# Patient Record
Sex: Male | Born: 1975 | Race: White | Hispanic: No | Marital: Married | Smoking: Current some day smoker
Health system: Southern US, Community
[De-identification: ages and names within clinical notes are randomized; demographics above are authoritative.]

## PROBLEM LIST (undated history)

## (undated) DIAGNOSIS — I10 Essential (primary) hypertension: Secondary | ICD-10-CM

## (undated) DIAGNOSIS — Z9989 Dependence on other enabling machines and devices: Secondary | ICD-10-CM

## (undated) DIAGNOSIS — H9190 Unspecified hearing loss, unspecified ear: Secondary | ICD-10-CM

## (undated) DIAGNOSIS — E039 Hypothyroidism, unspecified: Secondary | ICD-10-CM

## (undated) DIAGNOSIS — E079 Disorder of thyroid, unspecified: Secondary | ICD-10-CM

## (undated) DIAGNOSIS — K219 Gastro-esophageal reflux disease without esophagitis: Secondary | ICD-10-CM

## (undated) DIAGNOSIS — M51369 Other intervertebral disc degeneration, lumbar region without mention of lumbar back pain or lower extremity pain: Secondary | ICD-10-CM

## (undated) DIAGNOSIS — M1990 Unspecified osteoarthritis, unspecified site: Secondary | ICD-10-CM

## (undated) DIAGNOSIS — I2510 Atherosclerotic heart disease of native coronary artery without angina pectoris: Secondary | ICD-10-CM

## (undated) DIAGNOSIS — K760 Fatty (change of) liver, not elsewhere classified: Secondary | ICD-10-CM

## (undated) DIAGNOSIS — G4730 Sleep apnea, unspecified: Secondary | ICD-10-CM

## (undated) DIAGNOSIS — E785 Hyperlipidemia, unspecified: Secondary | ICD-10-CM

## (undated) DIAGNOSIS — I5189 Other ill-defined heart diseases: Secondary | ICD-10-CM

## (undated) DIAGNOSIS — Q892 Congenital malformations of other endocrine glands: Secondary | ICD-10-CM

## (undated) DIAGNOSIS — G4733 Obstructive sleep apnea (adult) (pediatric): Secondary | ICD-10-CM

## (undated) HISTORY — DX: Unspecified osteoarthritis, unspecified site: M19.90

## (undated) HISTORY — DX: Obstructive sleep apnea (adult) (pediatric): G47.33

## (undated) HISTORY — DX: Other ill-defined heart diseases: I51.89

## (undated) HISTORY — DX: Hyperlipidemia, unspecified: E78.5

## (undated) HISTORY — DX: Dependence on other enabling machines and devices: Z99.89

## (undated) HISTORY — DX: Fatty (change of) liver, not elsewhere classified: K76.0

## (undated) HISTORY — DX: Unspecified hearing loss, unspecified ear: H91.90

## (undated) HISTORY — PX: WISDOM TOOTH EXTRACTION: SHX21

## (undated) HISTORY — DX: Atherosclerotic heart disease of native coronary artery without angina pectoris: I25.10

## (undated) HISTORY — DX: Gastro-esophageal reflux disease without esophagitis: K21.9

## (undated) HISTORY — PX: FINGER SURGERY: SHX640

## (undated) HISTORY — DX: Other intervertebral disc degeneration, lumbar region without mention of lumbar back pain or lower extremity pain: M51.369

## (undated) HISTORY — DX: Sleep apnea, unspecified: G47.30

---

## 1999-01-18 ENCOUNTER — Emergency Department (HOSPITAL_COMMUNITY): Admission: EM | Admit: 1999-01-18 | Discharge: 1999-01-18 | Payer: Self-pay | Admitting: Emergency Medicine

## 2001-05-13 DIAGNOSIS — K760 Fatty (change of) liver, not elsewhere classified: Secondary | ICD-10-CM

## 2001-05-13 HISTORY — DX: Fatty (change of) liver, not elsewhere classified: K76.0

## 2001-05-13 HISTORY — PX: LIVER BIOPSY: SHX301

## 2001-05-13 HISTORY — PX: COLONOSCOPY: SHX174

## 2007-05-14 HISTORY — PX: VASECTOMY: SHX75

## 2010-06-02 ENCOUNTER — Encounter: Payer: Self-pay | Admitting: Internal Medicine

## 2013-11-25 ENCOUNTER — Other Ambulatory Visit: Payer: Self-pay | Admitting: Otolaryngology

## 2013-11-25 DIAGNOSIS — E041 Nontoxic single thyroid nodule: Secondary | ICD-10-CM

## 2013-11-26 ENCOUNTER — Ambulatory Visit
Admission: RE | Admit: 2013-11-26 | Discharge: 2013-11-26 | Disposition: A | Discharge status: Home / Self Care | Payer: Managed Care, Other (non HMO) | Source: Ambulatory Visit | Attending: Otolaryngology | Admitting: Otolaryngology

## 2013-11-26 DIAGNOSIS — E041 Nontoxic single thyroid nodule: Secondary | ICD-10-CM

## 2013-12-11 DIAGNOSIS — Q892 Congenital malformations of other endocrine glands: Secondary | ICD-10-CM

## 2013-12-11 HISTORY — DX: Congenital malformations of other endocrine glands: Q89.2

## 2013-12-13 ENCOUNTER — Encounter (HOSPITAL_BASED_OUTPATIENT_CLINIC_OR_DEPARTMENT_OTHER): Payer: Self-pay | Admitting: *Deleted

## 2013-12-13 NOTE — H&P (Signed)
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 Orders  US Soft Tissue Neck; Requested for: 23 Nov 2013. Discussed  Probable thyroglossal duct cyst. Recommend ultrasound evaluation and then we will discuss intervention following that. Reason For Visit  Antonio Ferguson is here today at the kind request of Candi LeashGoines, Valarie for consultation and opinion for knot under chin. HPI  5 or 6 week history of a slightly tender mass in the anterior neck slightly to the right of midline. No other symptoms related to it. He suffered a tick bite a week before. Allergies  Augmentin TABS; Diarrhea Percocet TABS; Nausea and Vomiting. Current Meds  Takes No Medications on a Regular Basis;; RPT. Active Problems  Acute sinusitis   (461.9) (J01.90) Asthma   (493.90) (J45.909) Cigar smoker   (305.1) (Z72.0) Cough   (786.2) (R05) Fatty liver   (571.8) (K76.0) Flu-like symptoms   (780.99) (R68.89) Hyperlipemia   (272.4) (E78.5) Nonalcoholic fatty liver disease   (571.8) (K76.0) Tick bite   (919.4,E906.4) (T14.8,W57.Lorne SkeensXXXA). PMH  Abnormal findings on diagnostic imaging of musculoskeletal system (793.7) (R93.7); Resolved: 17Dec2013; disc space narrowing L5-S1 History of hyperlipidemia (V12.29) (Z86.39) Other specified abnormal findings of blood chemistry (790.6) (R79.89); TSH. PSH  Liver Biopsy; '03 Liver Biopsy Oral Surgery Tooth Extraction Surgery Vas Deferens Vasectomy 22 Apr 2008; PUA GPE 04/22/2008-07/21/2008 DIAGNOSIS: ELECTIVE STERILIZATION. Family Hx  Benign Essential Hypertension; Brother Cardiac Defibrillator; maternal uncles Family history of diabetes mellitus (V18.0) (Z83.3); Paternal grandfather Family history of diabetes mellitus (V18.0) (Z83.3); paternal grandfather Family history of hearing loss: Mother (V19.2) (Z82.2) Family history of hypothyroidism: Brother (V18.19) (Z83.49) Family history of migraine headaches: Mother (V17.2) (Z82.0) Hyperlipidemia; brothers Hyperlipidemia;  Father Hypertension (V17.49); brothers Large Intestine Neoplasm; Paternal grandmother Denied Malignant Neoplasm Of Testis Peripheral Vascular Disease; father Premature Cardiovascular Disease; Maternal and paternal uncle with CABG's in their mid 2350s Sudden/Instantaneous Cardiac Death (V17.41); cousins, maternal uncles Denied Ulcerative Colitis. Personal Hx  Denied Alcohol Use (History) Caffeine Use; Three per day Cigar smoker (305.1) (Z72.0) Denied Current Smoker Denied Drug Use Exercising Regularly; Cardiovascular and weight lifting 1-2 hours at a time three to four days a week Marital History - Currently Married Never a smoker Occupation:; Emergency planning/management officerpolice officer Secondhand smoke exposure (V15.89) (Z77.22). ROS  12 system ROS was obtained and reviewed on the Health Maintenance form dated today.  Positive responses are shown above.  If the symptom is not checked, the patient has denied it. Vital Signs   Recorded by Skolimowski,Sharon on 23 Nov 2013 01:50 PM BP:120/80,  Height: 6 ft 1.5 in, Weight: 265 lb , BMI: 34.5 kg/m2,  BMI Calculated: 34.49 ,  BSA Calculated: 2.44. Physical Exam  APPEARANCE: Well developed, well nourished, in no acute distress.  Normal affect, in a pleasant mood.  Oriented to time, place and person. COMMUNICATION: Normal voice   HEAD & FACE:  No scars, lesions or masses of head and face.  Sinuses nontender to palpation.  Salivary glands without mass or tenderness.  Facial strength symmetric.  No facial lesion, scars, or mass. EYES: EOMI with normal primary gaze alignment. Visual acuity grossly intact.  PERRLA EXTERNAL EAR & NOSE: No scars, lesions or masses  EAC & TYMPANIC MEMBRANE:  EAC shows no obstructing lesions or debris and tympanic membranes are normal bilaterally with good movement to insufflation. GROSS HEARING: Normal  TMJ:  Nontender  INTRANASAL EXAM: No polyps or purulence.  NASOPHARYNX: Normal, without lesions. LIPS, TEETH & GUMS: No lip lesions, normal  dentition and normal gums. ORAL CAVITY/OROPHARYNX:  Oral  mucosa moist without lesion or asymmetry of the palate, tongue, tonsil or posterior pharynx. LARYNX (mirror exam):  No lesions of the epiglottis, false cord or TVC's and cords move well to phonation. HYPOPHARYNX (mirror exam): No lesions, asymmetry or pooling of secretions. NECK:  Supple without adenopathy or mass, except for a one and a half to 2 cm soft cystic mass at the level of the thyrohyoid membrane slightly to the right of midline. THYROID:  Normal with no masses palpable.  NEUROLOGIC:  No gross CN deficits. No nystagmus noted.   LYMPHATIC:  No enlarged nodes palpable. Signature  Electronically signed by : Serena Colonel  M.D.; 11/23/2013 2:12 PM EST. Reviewed by : Emilio Aspen  M.D.; 11/23/2013 3:46 PM EST. Reviewed by : Candi Leash  FNP-BC; 11/23/2013 4:24 PM EST.

## 2013-12-20 ENCOUNTER — Ambulatory Visit (HOSPITAL_BASED_OUTPATIENT_CLINIC_OR_DEPARTMENT_OTHER)
Admission: RE | Admit: 2013-12-20 | Discharge: 2013-12-21 | Disposition: A | Discharge status: Home / Self Care | Payer: Managed Care, Other (non HMO) | Source: Ambulatory Visit | Attending: Otolaryngology | Admitting: Otolaryngology

## 2013-12-20 ENCOUNTER — Encounter (HOSPITAL_BASED_OUTPATIENT_CLINIC_OR_DEPARTMENT_OTHER)
Admission: RE | Disposition: A | Discharge status: Home / Self Care | Payer: Self-pay | Source: Ambulatory Visit | Attending: Otolaryngology

## 2013-12-20 ENCOUNTER — Encounter (HOSPITAL_BASED_OUTPATIENT_CLINIC_OR_DEPARTMENT_OTHER): Payer: Managed Care, Other (non HMO) | Admitting: Anesthesiology

## 2013-12-20 ENCOUNTER — Ambulatory Visit (HOSPITAL_BASED_OUTPATIENT_CLINIC_OR_DEPARTMENT_OTHER): Payer: Managed Care, Other (non HMO) | Admitting: Anesthesiology

## 2013-12-20 ENCOUNTER — Encounter (HOSPITAL_BASED_OUTPATIENT_CLINIC_OR_DEPARTMENT_OTHER): Payer: Self-pay | Admitting: Anesthesiology

## 2013-12-20 DIAGNOSIS — E041 Nontoxic single thyroid nodule: Secondary | ICD-10-CM | POA: Insufficient documentation

## 2013-12-20 DIAGNOSIS — Z880 Allergy status to penicillin: Secondary | ICD-10-CM | POA: Diagnosis not present

## 2013-12-20 DIAGNOSIS — Q892 Congenital malformations of other endocrine glands: Secondary | ICD-10-CM

## 2013-12-20 HISTORY — PX: THYROGLOSSAL DUCT CYST: SHX297

## 2013-12-20 HISTORY — DX: Disorder of thyroid, unspecified: E07.9

## 2013-12-20 HISTORY — DX: Congenital malformations of other endocrine glands: Q89.2

## 2013-12-20 LAB — POCT HEMOGLOBIN-HEMACUE: HEMOGLOBIN: 15.1 g/dL (ref 13.0–17.0)

## 2013-12-20 SURGERY — EXCISION, THYROGLOSSAL DUCT CYST
Anesthesia: General | Site: Neck

## 2013-12-20 MED ORDER — CLINDAMYCIN HCL 300 MG PO CAPS: 300.0000 mg | ORAL_CAPSULE | Freq: Three times a day (TID) | ORAL | Status: DC

## 2013-12-20 MED ORDER — HYDROMORPHONE HCL PF 1 MG/ML IJ SOLN
INTRAMUSCULAR | Status: AC
  Filled 2013-12-20: qty 1

## 2013-12-20 MED ORDER — DEXTROSE-NACL 5-0.9 % IV SOLN
INTRAVENOUS | Status: DC
  Administered 2013-12-20: 11:00:00 via INTRAVENOUS

## 2013-12-20 MED ORDER — ONDANSETRON HCL 4 MG/2ML IJ SOLN: 4.0000 mg | Freq: Four times a day (QID) | INTRAMUSCULAR | Status: DC | PRN

## 2013-12-20 MED ORDER — HYDROCODONE-ACETAMINOPHEN 5-325 MG PO TABS
1.0000 | ORAL_TABLET | ORAL | Status: DC | PRN
  Administered 2013-12-20 – 2013-12-21 (×6): 2 via ORAL
  Filled 2013-12-20 (×6): qty 2

## 2013-12-20 MED ORDER — OXYMETAZOLINE HCL 0.05 % NA SOLN
NASAL | Status: AC
  Filled 2013-12-20: qty 15

## 2013-12-20 MED ORDER — LIDOCAINE-EPINEPHRINE 1 %-1:100000 IJ SOLN
INTRAMUSCULAR | Status: AC
  Filled 2013-12-20: qty 1

## 2013-12-20 MED ORDER — OXYCODONE HCL 5 MG/5ML PO SOLN: 5.0000 mg | Freq: Once | ORAL | Status: AC | PRN

## 2013-12-20 MED ORDER — IBUPROFEN 100 MG/5ML PO SUSP
ORAL | Status: AC
  Filled 2013-12-20: qty 20

## 2013-12-20 MED ORDER — CEFAZOLIN SODIUM-DEXTROSE 2-3 GM-% IV SOLR
INTRAVENOUS | Status: DC | PRN
  Administered 2013-12-20: 2 g via INTRAVENOUS

## 2013-12-20 MED ORDER — PROMETHAZINE HCL 25 MG PO TABS: 25.0000 mg | ORAL_TABLET | Freq: Four times a day (QID) | ORAL | Status: DC | PRN

## 2013-12-20 MED ORDER — PROPOFOL 10 MG/ML IV BOLUS
INTRAVENOUS | Status: DC | PRN
  Administered 2013-12-20: 100 mg via INTRAVENOUS
  Administered 2013-12-20: 200 mg via INTRAVENOUS

## 2013-12-20 MED ORDER — IBUPROFEN 100 MG/5ML PO SUSP
400.0000 mg | Freq: Four times a day (QID) | ORAL | Status: DC | PRN
  Administered 2013-12-20: 400 mg via ORAL

## 2013-12-20 MED ORDER — HYDROCODONE-ACETAMINOPHEN 7.5-325 MG PO TABS: 1.0000 | ORAL_TABLET | Freq: Four times a day (QID) | ORAL | Status: DC | PRN

## 2013-12-20 MED ORDER — LACTATED RINGERS IV SOLN
INTRAVENOUS | Status: DC
  Administered 2013-12-20 (×2): via INTRAVENOUS

## 2013-12-20 MED ORDER — BACITRACIN ZINC 500 UNIT/GM EX OINT
TOPICAL_OINTMENT | CUTANEOUS | Status: AC
  Filled 2013-12-20: qty 0.9

## 2013-12-20 MED ORDER — CEFAZOLIN SODIUM-DEXTROSE 2-3 GM-% IV SOLR
2.0000 g | INTRAVENOUS | Status: AC
  Administered 2013-12-20: 2 g via INTRAVENOUS

## 2013-12-20 MED ORDER — MIDAZOLAM HCL 2 MG/2ML IJ SOLN
INTRAMUSCULAR | Status: AC
  Filled 2013-12-20: qty 2

## 2013-12-20 MED ORDER — OXYCODONE HCL 5 MG PO TABS: 5.0000 mg | ORAL_TABLET | Freq: Once | ORAL | Status: AC | PRN

## 2013-12-20 MED ORDER — ONDANSETRON HCL 4 MG/2ML IJ SOLN
INTRAMUSCULAR | Status: DC | PRN
  Administered 2013-12-20: 4 mg via INTRAVENOUS

## 2013-12-20 MED ORDER — HYDROMORPHONE HCL PF 1 MG/ML IJ SOLN
0.2500 mg | INTRAMUSCULAR | Status: DC | PRN
  Administered 2013-12-20 (×4): 0.5 mg via INTRAVENOUS

## 2013-12-20 MED ORDER — MIDAZOLAM HCL 5 MG/5ML IJ SOLN
INTRAMUSCULAR | Status: DC | PRN
  Administered 2013-12-20: 2 mg via INTRAVENOUS

## 2013-12-20 MED ORDER — MIDAZOLAM HCL 2 MG/ML PO SYRP: 12.0000 mg | ORAL_SOLUTION | Freq: Once | ORAL | Status: DC | PRN

## 2013-12-20 MED ORDER — PROMETHAZINE HCL 25 MG RE SUPP: 25.0000 mg | Freq: Four times a day (QID) | RECTAL | Status: DC | PRN

## 2013-12-20 MED ORDER — CLINDAMYCIN HCL 300 MG PO CAPS
300.0000 mg | ORAL_CAPSULE | Freq: Three times a day (TID) | ORAL | Status: DC
  Administered 2013-12-20 – 2013-12-21 (×3): 300 mg via ORAL
  Filled 2013-12-20 (×3): qty 1

## 2013-12-20 MED ORDER — LIDOCAINE HCL (CARDIAC) 20 MG/ML IV SOLN
INTRAVENOUS | Status: DC | PRN
Start: 2013-12-20 — End: 2013-12-20
  Administered 2013-12-20: 50 mg via INTRAVENOUS

## 2013-12-20 MED ORDER — FENTANYL CITRATE 0.05 MG/ML IJ SOLN
INTRAMUSCULAR | Status: AC
  Filled 2013-12-20: qty 6

## 2013-12-20 MED ORDER — MIDAZOLAM HCL 2 MG/2ML IJ SOLN: 1.0000 mg | INTRAMUSCULAR | Status: DC | PRN

## 2013-12-20 MED ORDER — CEFAZOLIN SODIUM-DEXTROSE 2-3 GM-% IV SOLR
INTRAVENOUS | Status: AC
  Filled 2013-12-20: qty 50

## 2013-12-20 MED ORDER — FENTANYL CITRATE 0.05 MG/ML IJ SOLN
INTRAMUSCULAR | Status: DC | PRN
  Administered 2013-12-20 (×2): 50 ug via INTRAVENOUS

## 2013-12-20 MED ORDER — SUCCINYLCHOLINE CHLORIDE 20 MG/ML IJ SOLN
INTRAMUSCULAR | Status: DC | PRN
  Administered 2013-12-20: 50 mg via INTRAVENOUS

## 2013-12-20 MED ORDER — FENTANYL CITRATE 0.05 MG/ML IJ SOLN: 50.0000 ug | INTRAMUSCULAR | Status: DC | PRN

## 2013-12-20 MED ORDER — 0.9 % SODIUM CHLORIDE (POUR BTL) OPTIME
TOPICAL | Status: DC | PRN
  Administered 2013-12-20: 300 mL

## 2013-12-20 MED ORDER — DEXAMETHASONE SODIUM PHOSPHATE 4 MG/ML IJ SOLN
INTRAMUSCULAR | Status: DC | PRN
  Administered 2013-12-20: 10 mg via INTRAVENOUS

## 2013-12-20 SURGICAL SUPPLY — 61 items
ADH SKN CLS APL DERMABOND .7 (GAUZE/BANDAGES/DRESSINGS) ×1
APL SKNCLS STERI-STRIP NONHPOA (GAUZE/BANDAGES/DRESSINGS)
ATTRACTOMAT 16X20 MAGNETIC DRP (DRAPES) IMPLANT
BENZOIN TINCTURE PRP APPL 2/3 (GAUZE/BANDAGES/DRESSINGS) IMPLANT
BLADE SURG 15 STRL LF DISP TIS (BLADE) ×1 IMPLANT
BLADE SURG 15 STRL SS (BLADE) ×3
CANISTER SUCT 1200ML W/VALVE (MISCELLANEOUS) ×3 IMPLANT
CLEANER CAUTERY TIP 5X5 PAD (MISCELLANEOUS) IMPLANT
CLOSURE WOUND 1/2 X4 (GAUZE/BANDAGES/DRESSINGS)
CLOSURE WOUND 1/4X4 (GAUZE/BANDAGES/DRESSINGS)
CORDS BIPOLAR (ELECTRODE) IMPLANT
COVER MAYO STAND STRL (DRAPES) ×3 IMPLANT
COVER TABLE BACK 60X90 (DRAPES) ×3 IMPLANT
DECANTER SPIKE VIAL GLASS SM (MISCELLANEOUS) IMPLANT
DERMABOND ADVANCED (GAUZE/BANDAGES/DRESSINGS) ×2
DERMABOND ADVANCED .7 DNX12 (GAUZE/BANDAGES/DRESSINGS) IMPLANT
DRAIN PENROSE 1/4X12 LTX STRL (WOUND CARE) IMPLANT
DRAPE U-SHAPE 76X120 STRL (DRAPES) ×3 IMPLANT
DRSG EMULSION OIL 3X3 NADH (GAUZE/BANDAGES/DRESSINGS) IMPLANT
ELECT COATED BLADE 2.86 ST (ELECTRODE) ×3 IMPLANT
ELECT REM PT RETURN 9FT ADLT (ELECTROSURGICAL) ×3
ELECTRODE REM PT RTRN 9FT ADLT (ELECTROSURGICAL) ×1 IMPLANT
GAUZE SPONGE 4X4 16PLY XRAY LF (GAUZE/BANDAGES/DRESSINGS) IMPLANT
GLOVE ECLIPSE 6.5 STRL STRAW (GLOVE) IMPLANT
GLOVE ECLIPSE 7.5 STRL STRAW (GLOVE) ×1 IMPLANT
GLOVE EXAM NITRILE PF MED BLUE (GLOVE) ×2 IMPLANT
GLOVE SURG SS PI 6.5 STRL IVOR (GLOVE) ×2 IMPLANT
GLOVE SURG SS PI 7.5 STRL IVOR (GLOVE) ×2 IMPLANT
GOWN STRL REUS W/ TWL LRG LVL3 (GOWN DISPOSABLE) ×1 IMPLANT
GOWN STRL REUS W/TWL LRG LVL3 (GOWN DISPOSABLE) ×3
NDL HYPO 25X1 1.5 SAFETY (NEEDLE) ×1 IMPLANT
NEEDLE HYPO 25X1 1.5 SAFETY (NEEDLE) ×3 IMPLANT
NS IRRIG 1000ML POUR BTL (IV SOLUTION) ×3 IMPLANT
PACK BASIN DAY SURGERY FS (CUSTOM PROCEDURE TRAY) ×3 IMPLANT
PAD CLEANER CAUTERY TIP 5X5 (MISCELLANEOUS) ×2
PENCIL FOOT CONTROL (ELECTRODE) ×3 IMPLANT
RUBBERBAND STERILE (MISCELLANEOUS) ×2 IMPLANT
SHEET MEDIUM DRAPE 40X70 STRL (DRAPES) IMPLANT
SPONGE GAUZE 4X4 12PLY STER LF (GAUZE/BANDAGES/DRESSINGS) ×2 IMPLANT
SPONGE INTESTINAL PEANUT (DISPOSABLE) IMPLANT
STAPLER VISISTAT 35W (STAPLE) IMPLANT
STRIP CLOSURE SKIN 1/2X4 (GAUZE/BANDAGES/DRESSINGS) IMPLANT
STRIP CLOSURE SKIN 1/4X4 (GAUZE/BANDAGES/DRESSINGS) IMPLANT
SUCTION FRAZIER TIP 10 FR DISP (SUCTIONS) IMPLANT
SUT CHROMIC 3 0 PS 2 (SUTURE) ×2 IMPLANT
SUT CHROMIC 4 0 P 3 18 (SUTURE) IMPLANT
SUT ETHILON 5 0 P 3 18 (SUTURE)
SUT NYLON ETHILON 5-0 P-3 1X18 (SUTURE) IMPLANT
SUT SILK 3 0 TIES 17X18 (SUTURE)
SUT SILK 3-0 18XBRD TIE BLK (SUTURE) IMPLANT
SUT SILK 4 0 TIES 17X18 (SUTURE) IMPLANT
SUT SURG 6 0 PRE1/P 10 (SUTURE) IMPLANT
SWAB COLLECTION DEVICE MRSA (MISCELLANEOUS) IMPLANT
SYR BULB 3OZ (MISCELLANEOUS) ×3 IMPLANT
SYR CONTROL 10ML LL (SYRINGE) ×3 IMPLANT
TAPE PAPER 3X10 WHT MICROPORE (GAUZE/BANDAGES/DRESSINGS) ×3 IMPLANT
TOWEL OR 17X24 6PK STRL BLUE (TOWEL DISPOSABLE) ×3 IMPLANT
TRAY DSU PREP LF (CUSTOM PROCEDURE TRAY) ×3 IMPLANT
TUBE ANAEROBIC SPECIMEN COL (MISCELLANEOUS) IMPLANT
TUBE CONNECTING 20'X1/4 (TUBING) ×1
TUBE CONNECTING 20X1/4 (TUBING) ×2 IMPLANT

## 2013-12-20 NOTE — Discharge Instructions (Signed)
You may shower on Wednesday. It is okay to use soap and water but do not use any creams, oils or ointment.

## 2013-12-20 NOTE — Anesthesia Postprocedure Evaluation (Signed)
Anesthesia Post Note  Patient: Antonio RankinsRandall D Ferguson  Procedure(s) Performed: Procedure(s) (LRB): EXCISION THYROGLOSSAL DUCT CYST (N/A)  Anesthesia type: General  Patient location: PACU  Post pain: Pain level controlled and Adequate analgesia  Post assessment: Post-op Vital signs reviewed, Patient's Cardiovascular Status Stable, Respiratory Function Stable, Patent Airway and Pain level controlled  Last Vitals:  Filed Vitals:   12/20/13 1045  BP: 141/84  Pulse: 79  Temp:   Resp: 15    Post vital signs: Reviewed and stable  Level of consciousness: awake, alert  and oriented  Complications: No apparent anesthesia complications

## 2013-12-20 NOTE — Progress Notes (Signed)
Day of Surgery  Subjective: Feeling great. No problems voice nl  Objective: Vital signs in last 24 hours: Temp:  [96.9 F (36.1 C)-98.2 F (36.8 C)] 96.9 F (36.1 C) (08/10 1900) Pulse Rate:  [68-96] 81 (08/10 1900) Resp:  [14-23] 16 (08/10 1900) BP: (127-141)/(74-88) 132/74 mmHg (08/10 1900) SpO2:  [93 %-100 %] 95 % (08/10 1900) Weight:  [122.641 kg (270 lb 6 oz)] 122.641 kg (270 lb 6 oz) (08/10 0756)    Intake/Output from previous day:   Intake/Output this shift:    wound excellent.  Lab Results:   Recent Labs  12/20/13 0813  HGB 15.1   BMET No results found for this basename: NA, K, CL, CO2, GLUCOSE, BUN, CREATININE, CALCIUM,  in the last 72 hours PT/INR No results found for this basename: LABPROT, INR,  in the last 72 hours ABG No results found for this basename: PHART, PCO2, PO2, HCO3,  in the last 72 hours  Studies/Results: No results found.  Anti-infectives: Anti-infectives   Start     Dose/Rate Route Frequency Ordered Stop   12/20/13 1400  clindamycin (CLEOCIN) capsule 300 mg     300 mg Oral 3 times per day 12/20/13 1134     12/20/13 0753  ceFAZolin (ANCEF) IVPB 2 g/50 mL premix     2 g 100 mL/hr over 30 Minutes Intravenous On call to O.R. 12/20/13 0753 12/20/13 0857   12/20/13 0000  clindamycin (CLEOCIN) 300 MG capsule     300 mg Oral 3 times daily 12/20/13 1134        Assessment/Plan: s/p Procedure(s): EXCISION THYROGLOSSAL DUCT CYST (N/A) continue care drain out in am  LOS: 0 days    Suzanna ObeyBYERS, Arslan Kier 12/20/2013

## 2013-12-20 NOTE — Anesthesia Preprocedure Evaluation (Signed)
Anesthesia Evaluation  Patient identified by MRN, date of birth, ID band Patient awake    Reviewed: Allergy & Precautions, H&P , NPO status , Patient's Chart, lab work & pertinent test results  Airway Mallampati: II  Neck ROM: full    Dental   Pulmonary neg pulmonary ROS,          Cardiovascular negative cardio ROS      Neuro/Psych    GI/Hepatic   Endo/Other  obese  Renal/GU      Musculoskeletal   Abdominal   Peds  Hematology   Anesthesia Other Findings   Reproductive/Obstetrics                           Anesthesia Physical Anesthesia Plan  ASA: I  Anesthesia Plan: General   Post-op Pain Management:    Induction: Intravenous  Airway Management Planned: Oral ETT  Additional Equipment:   Intra-op Plan:   Post-operative Plan: Extubation in OR  Informed Consent: I have reviewed the patients History and Physical, chart, labs and discussed the procedure including the risks, benefits and alternatives for the proposed anesthesia with the patient or authorized representative who has indicated his/her understanding and acceptance.     Plan Discussed with: CRNA, Anesthesiologist and Surgeon  Anesthesia Plan Comments:         Anesthesia Quick Evaluation

## 2013-12-20 NOTE — Op Note (Signed)
OPERATIVE REPORT  DATE OF SURGERY: 12/20/2013  PATIENT:  Antonio Ferguson,  38 y.o. male  PRE-OPERATIVE DIAGNOSIS:  THYROGLOSSAL DUCT CYST  POST-OPERATIVE DIAGNOSIS:  THYROGLOSSAL DUCT CYST  PROCEDURE:  Procedure(s): EXCISION THYROGLOSSAL DUCT CYST  SURGEON:  Susy FrizzleJefry H Elliannah Wayment, MD  ASSISTANTS: none  ANESTHESIA:   General   EBL:  20 ml  DRAINS: Sterile rubber band  LOCAL MEDICATIONS USED:  None  SPECIMEN:  Thyroglossal duct cyst  COUNTS:  Correct  PROCEDURE DETAILS: The patient was taken to the operating room and placed on the operating table in the supine position. Following induction of general endotracheal anesthesia the neck was prepped and draped in a standard fashion. A transverse incision was outlined with a marking pen in an upper cervical skin crease right at the midline. The left cautery used to incise the skin and subcutaneous tissue. The platysma was divided. Subplatysmal flap was developed superiorly up towards the suprahyoid region. The mass was identified. The mass was dissected out from surrounding tissue. It was at the midline just above the hyoid. The mass measured approximately 2 cm in greatest dimension and was soft and cystlike. Careful dissection around the cyst was accomplished until the hyoid bone was identified. The hyoid was freed of surrounding musculature between the greater horns and a bone-cutting rongeur was used to cut to the middle section of the hyoid out. Inspection deep to the hyoid towards the base of tongue did not reveal any residual tract or any other pathology. The specimen was sent for pathologic evaluation. The wound was irrigated with Ferguson and cautery was used for completion of hemostasis. The wound was closed in layers using 3-0 chromic sutures to reapproximate the suprahyoid musculature at the midline, the platysma layer and a subcuticular interrupted closure as well. A rubber band drain was left in the wound and exited through the center of the  incision. Dermabond was used on the skin. Sterile dressing was applied. The patient was awakened, extubated and transferred to recovery in stable condition.    PATIENT DISPOSITION:  To PACU, stable

## 2013-12-20 NOTE — Interval H&P Note (Signed)
History and Physical Interval Note:  12/20/2013 8:07 AM  Donney Rankinsandall D Caster  has presented today for surgery, with the diagnosis of THYROGLOSSAL DUCT CYST  The various methods of treatment have been discussed with the patient and family. After consideration of risks, benefits and other options for treatment, the patient has consented to  Procedure(s): EXCISION THYROGLOSSAL DUCT CYST (N/A) as a surgical intervention .  The patient's history has been reviewed, patient examined, no change in status, stable for surgery.  I have reviewed the patient's chart and labs.  Questions were answered to the patient's satisfaction.     Saketh Daubert

## 2013-12-20 NOTE — Anesthesia Procedure Notes (Signed)
Procedure Name: Intubation Date/Time: 12/20/2013 8:36 AM Performed by: [place] DesanctisLINKA, Blong Busk L Pre-anesthesia Checklist: Patient identified, Emergency Drugs available, Suction available, Patient being monitored and Timeout performed Patient Re-evaluated:Patient Re-evaluated prior to inductionOxygen Delivery Method: Circle System Utilized Preoxygenation: Pre-oxygenation with 100% oxygen Intubation Type: IV induction Ventilation: Mask ventilation without difficulty Laryngoscope Size: Miller and 3 Grade View: Grade III Tube type: Oral Tube size: 7.0 mm Number of attempts: 1 Airway Equipment and Method: stylet and oral airway Placement Confirmation: ETT inserted through vocal cords under direct vision,  positive ETCO2 and breath sounds checked- equal and bilateral Tube secured with: Tape Dental Injury: Teeth and Oropharynx as per pre-operative assessment

## 2013-12-20 NOTE — Transfer of Care (Signed)
Immediate Anesthesia Transfer of Care Note  Patient: Antonio Ferguson  Procedure(s) Performed: Procedure(s): EXCISION THYROGLOSSAL DUCT CYST (N/A)  Patient Location: PACU  Anesthesia Type:General  Level of Consciousness: awake and patient cooperative  Airway & Oxygen Therapy: Patient Spontanous Breathing and Patient connected to face mask oxygen  Post-op Assessment: Report given to PACU RN and Post -op Vital signs reviewed and stable  Post vital signs: Reviewed and stable  Complications: No apparent anesthesia complications

## 2013-12-21 DIAGNOSIS — E041 Nontoxic single thyroid nodule: Secondary | ICD-10-CM | POA: Diagnosis not present

## 2013-12-21 NOTE — Discharge Summary (Signed)
Physician Discharge Summary  Patient ID: Antonio RankinsRandall D Ferguson MRN: [id number] DOB/AGE: 1976/03/08 38 y.o.  Admit date: 12/20/2013 Discharge date: 12/21/2013  Admission Diagnoses:Thyroglossal duct cyst  Discharge Diagnoses:  Active Problems:   Thyroglossal duct cyst   Discharged Condition: good  Hospital Course: no complications  Consults: none  Significant Diagnostic Studies: none  Treatments: surgery: Sistrunk procedure  Discharge Exam: Blood pressure 120/74, pulse 74, temperature 97 F (36.1 C), temperature source Oral, resp. rate 16, height 6\' 1"  (1.854 m), weight 270 lb 6 oz (122.641 kg), SpO2 95.00%. PHYSICAL EXAM: Incision intact, drain removed. Voice normal.   Disposition: Final discharge disposition not confirmed  Discharge Instructions   Diet - low sodium heart healthy    Complete by:  As directed      Increase activity slowly    Complete by:  As directed             Medication List         clindamycin 300 MG capsule  Commonly known as:  CLEOCIN  Take 1 capsule (300 mg total) by mouth 3 (three) times daily.     HYDROcodone-acetaminophen 7.5-325 MG per tablet  Commonly known as:  NORCO  Take 1 tablet by mouth every 6 (six) hours as needed for moderate pain.     promethazine 25 MG suppository  Commonly known as:  PHENERGAN  Place 1 suppository (25 mg total) rectally every 6 (six) hours as needed for nausea or vomiting.           Follow-up Information   Follow up with Serena ColonelOSEN, Bunny Kleist, MD. Schedule an appointment as soon as possible for a visit in 1 week.   Specialty:  Otolaryngology   Contact information:   [address] [place] [address] [phone]       Signed: Serena ColonelROSEN, Tiera Mensinger 12/21/2013, 8:07 AM

## 2013-12-22 ENCOUNTER — Encounter (HOSPITAL_BASED_OUTPATIENT_CLINIC_OR_DEPARTMENT_OTHER): Payer: Self-pay | Admitting: Otolaryngology

## 2014-02-10 DIAGNOSIS — Z9989 Dependence on other enabling machines and devices: Secondary | ICD-10-CM

## 2014-02-10 DIAGNOSIS — G4733 Obstructive sleep apnea (adult) (pediatric): Secondary | ICD-10-CM

## 2014-02-10 HISTORY — DX: Dependence on other enabling machines and devices: Z99.89

## 2014-02-10 HISTORY — DX: Obstructive sleep apnea (adult) (pediatric): G47.33

## 2014-12-05 LAB — COMPREHENSIVE METABOLIC PANEL
ALBUMIN: 4.4
ALT: 75
AST: 31 U/L
Alkaline Phosphatase: 102 U/L
Creat: 1.09
GGT: 32 U/L (ref 18–76)
Glucose: 98

## 2014-12-05 LAB — LIPID PANEL
CHOLESTEROL: 192
HDL: 32.9
LDL (calc): 94
Triglycerides: 322

## 2014-12-05 LAB — HEPATITIS B SURFACE ANTIGEN: Hepatitis B Surface Antigen: NONREACTIVE

## 2014-12-05 LAB — HEPATITIS C ANTIBODY: Hep C Virus Ab: NONREACTIVE

## 2014-12-27 ENCOUNTER — Ambulatory Visit: Payer: Managed Care, Other (non HMO) | Admitting: Internal Medicine

## 2015-04-17 ENCOUNTER — Ambulatory Visit: Payer: Managed Care, Other (non HMO) | Admitting: Family Medicine

## 2015-06-26 ENCOUNTER — Ambulatory Visit (INDEPENDENT_AMBULATORY_CARE_PROVIDER_SITE_OTHER): Payer: Managed Care, Other (non HMO) | Admitting: Cardiology

## 2015-06-26 ENCOUNTER — Encounter: Payer: Self-pay | Admitting: Cardiology

## 2015-06-26 VITALS — BP 132/76 | HR 72 | Ht 73.0 in | Wt 279.0 lb

## 2015-06-26 DIAGNOSIS — E785 Hyperlipidemia, unspecified: Secondary | ICD-10-CM

## 2015-06-26 DIAGNOSIS — Z8249 Family history of ischemic heart disease and other diseases of the circulatory system: Secondary | ICD-10-CM

## 2015-06-26 DIAGNOSIS — R55 Syncope and collapse: Secondary | ICD-10-CM | POA: Diagnosis not present

## 2015-06-26 MED ORDER — OMEGA-3-ACID ETHYL ESTERS 1 G PO CAPS: 4.0000 g | ORAL_CAPSULE | Freq: Every day | ORAL | Status: DC

## 2015-06-26 NOTE — Patient Instructions (Signed)
Your physician has recommended you make the following change in your medication:  1.) start LOVAZA 1G tablets; take 4 G daily  Your physician has requested that you have an echocardiogram. Echocardiography is a painless test that uses sound waves to create images of your heart. It provides your doctor with information about the size and shape of your heart and how well your heart's chambers and valves are working. This procedure takes approximately one hour. There are no restrictions for this procedure.  Your physician has recommended that you wear a holter monitor. Holter monitors are medical devices that record the heart's electrical activity. Doctors most often use these monitors to diagnose arrhythmias. Arrhythmias are problems with the speed or rhythm of the heartbeat. The monitor is a small, portable device. You can wear one while you do your normal daily activities. This is usually used to diagnose what is causing palpitations/syncope (passing out).  Your physician recommends that you schedule a follow-up appointment with Dr. Delton See at her next available date.  Your physician recommends that you return for lab work in: 2 months (CMET, LIPIDS)

## 2015-06-26 NOTE — Progress Notes (Signed)
Patient ID: Antonio Ferguson, male   DOB: March 13, 1976, 40 y.o.   MRN: [id number]      Cardiology Office Note   Date:  06/26/2015   ID:  TYLOR COURTWRIGHT, DOB October 29, 1975, MRN [id number]  PCP:  Rockney Ghee., PA-C  Cardiologist: Lars Masson, MD   Chief complain: Presyncope, dizziness   History of Present Illness: Antonio Ferguson is a 40 y.o. male who presents for evaluation of presyncopal episodes. Patient is a young active gentleman who works at [employer] in [place] he is very active currently training for [employer] and denies any chest pain or shortness of breath while exerting himself. He has developed 2 episodes of presyncope in the last months one was while he was driving and he felt like he is going to pass out. This was not associated with chest pain or shortness of breath. He is not aware of any palpitations during that time. The second episode happened while he was waiting for food in a restaurant. And his family there is a significant family history of premature coronary artery disease, 5 of his uncles from his father's side had heart attack in their early 74s. His father had a femoropopliteal bypass and a stent in his 39s. His brother was diagnosed with hyperlipidemia. He denies any claudications, personal nocturnal dyspnea orthopnea or lower extremity edema.    Past Medical History  Diagnosis Date  . 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 Thyroid disorder     is being monitored for possible Hashimoto's; no current med.  . Dizziness     Past Surgical History  Procedure Laterality Date  . Wisdom tooth extraction    . Liver biopsy    . Thyroglossal duct cyst N/A 12/20/2013    Procedure: EXCISION THYROGLOSSAL DUCT CYST;  Surgeon: Serena Colonel, MD;  Location: [facility];  Service: ENT;  Laterality: N/A;     No current outpatient prescriptions on file.   No current facility-administered medications for this visit.    Allergies:    Oxycodone-acetaminophen and Latex    Social History:  The patient  reports that he has never smoked. He has never used smokeless tobacco. He reports that he drinks alcohol. He reports that he does not use illicit drugs.   Family History:  The patient's family history includes Diabetes in his father; Healthy in his mother; Heart Problems in his father; Hyperlipidemia in his father; Hypertension in his father; Thyroid disease in his brother.   ROS:  Please see the history of present illness.   Otherwise, review of systems are positive for none.   All other systems are reviewed and negative.   PHYSICAL EXAM: VS:  BP 132/76 mmHg  Pulse 72  Ht  (1.854 m)  Wt 279 lb (126.554 kg)  BMI 36.82 kg/m2 , BMI Body mass index is 36.82 kg/(m^2). GEN: Well nourished, well developed, in no acute distress HEENT: normal Neck: no JVD, carotid bruits, or masses Cardiac: RRR; no murmurs, rubs, or gallops,no edema  Respiratory:  clear to auscultation bilaterally, normal work of breathing GI: soft, nontender, nondistended, + BS MS: no deformity or atrophy Skin: warm and dry, no rash Neuro:  Strength and sensation are intact Psych: euthymic mood, full affect  EKG:  EKG is ordered today. The ekg ordered today demonstrates normal sinus rhythm, negative T waves in leads III and aVF.  Recent Labs: No results found for requested labs within last 365 days.   Lipid Panel No results  found for: CHOL, TRIG, HDL, CHOLHDL, VLDL, LDLCALC, LDLDIRECT   Wt Readings from Last 3 Encounters:  06/26/15 279 lb (126.554 kg)  12/20/13 270 lb 6 oz (122.641 kg)      ASSESSMENT AND PLAN:  1. Recurrent presyncopal episodes - we will check 48 hour Holter monitor to evaluate for any possible arrhythmias. We will also schedule an echocardiogram to evaluate for systolic diastolic function.  2. Family history of premature coronary artery disease - in combination with high triglycerides we will start treatment see  below.  3. Hyperlipidemia - LDL 94, HDL 64, triglycerides 322, ALT 75, AST 31. We will start low was a 4 g by mouth daily, we will check CMP and lipids in 2 months.  Follow-up in 2 months.  Signed, Lars Masson, MD  06/26/2015 8:50 AM    [facility] [address] [place], [place], [place]  [address] Phone: [phone]; Fax: [phone]

## 2015-06-30 ENCOUNTER — Ambulatory Visit (INDEPENDENT_AMBULATORY_CARE_PROVIDER_SITE_OTHER): Payer: Managed Care, Other (non HMO)

## 2015-06-30 DIAGNOSIS — R55 Syncope and collapse: Secondary | ICD-10-CM | POA: Diagnosis not present

## 2015-06-30 DIAGNOSIS — Z8249 Family history of ischemic heart disease and other diseases of the circulatory system: Secondary | ICD-10-CM

## 2015-06-30 DIAGNOSIS — E785 Hyperlipidemia, unspecified: Secondary | ICD-10-CM | POA: Diagnosis not present

## 2015-07-07 ENCOUNTER — Other Ambulatory Visit: Payer: Self-pay

## 2015-07-07 ENCOUNTER — Ambulatory Visit (HOSPITAL_COMMUNITY): Payer: Managed Care, Other (non HMO) | Attending: Cardiovascular Disease

## 2015-07-07 DIAGNOSIS — Z8249 Family history of ischemic heart disease and other diseases of the circulatory system: Secondary | ICD-10-CM | POA: Diagnosis not present

## 2015-07-07 DIAGNOSIS — R55 Syncope and collapse: Secondary | ICD-10-CM | POA: Insufficient documentation

## 2015-07-07 DIAGNOSIS — E785 Hyperlipidemia, unspecified: Secondary | ICD-10-CM | POA: Diagnosis not present

## 2015-07-07 DIAGNOSIS — I517 Cardiomegaly: Secondary | ICD-10-CM | POA: Diagnosis not present

## 2015-07-17 ENCOUNTER — Encounter: Payer: Self-pay | Admitting: Family Medicine

## 2015-07-17 ENCOUNTER — Ambulatory Visit (INDEPENDENT_AMBULATORY_CARE_PROVIDER_SITE_OTHER): Payer: Managed Care, Other (non HMO) | Admitting: Family Medicine

## 2015-07-17 VITALS — BP 126/86 | HR 84 | Temp 98.5°F | Ht 73.0 in | Wt 281.5 lb

## 2015-07-17 DIAGNOSIS — E785 Hyperlipidemia, unspecified: Secondary | ICD-10-CM

## 2015-07-17 DIAGNOSIS — IMO0001 Reserved for inherently not codable concepts without codable children: Secondary | ICD-10-CM

## 2015-07-17 DIAGNOSIS — E079 Disorder of thyroid, unspecified: Secondary | ICD-10-CM

## 2015-07-17 DIAGNOSIS — K760 Fatty (change of) liver, not elsewhere classified: Secondary | ICD-10-CM | POA: Diagnosis not present

## 2015-07-17 DIAGNOSIS — E038 Other specified hypothyroidism: Secondary | ICD-10-CM | POA: Insufficient documentation

## 2015-07-17 DIAGNOSIS — E039 Hypothyroidism, unspecified: Secondary | ICD-10-CM | POA: Insufficient documentation

## 2015-07-17 DIAGNOSIS — K7581 Nonalcoholic steatohepatitis (NASH): Secondary | ICD-10-CM | POA: Insufficient documentation

## 2015-07-17 NOTE — Assessment & Plan Note (Addendum)
Mainly hypertriglyceridemia - lovaza started by cards recently. Upcoming monitoring labwork.

## 2015-07-17 NOTE — Patient Instructions (Signed)
I will add thyroid levels to next blood work - make sure to ask all labs ordered be drawn next labwork. You are doing well today Return as needed or in 6-12 months for physical.

## 2015-07-17 NOTE — Assessment & Plan Note (Signed)
+  fmhx. H/o abnormal TFTs Has seen endo. Will need routine TFT screening

## 2015-07-17 NOTE — Assessment & Plan Note (Signed)
Discussed healthy diet and lifestyle changes to affect sustainable weight loss  

## 2015-07-17 NOTE — Progress Notes (Signed)
BP 126/86 mmHg  Pulse 84  Temp(Src) 98.5 F (36.9 C) (Oral)  Ht  (1.854 m)  Wt 281 lb 8 oz (127.688 kg)  BMI 37.15 kg/m2   CC: new pt to establish care  Subjective:    Patient ID: Antonio Ferguson, male    DOB: 04-Dec-1975, 40 y.o.   MRN: [id number]  HPI: Antonio Ferguson is a 40 y.o. male presenting on 07/17/2015 for Establish Care   Prior saw Dr Andria Meuse in [place] who retired. 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   Recently saw cardiologist for presyncope/dizziness while driving - EKG, echocardiogram, and 48 hr Holter monitor normal.   HLD - started on lovaza last month by cardiology.   Thyroid disease - TFTs elevated in the past - possible Hashimoto disease. Monitoring yearly. Brother with thyroid disease as well.   Wheezing with running in the cold - now improved. Has never needed albuterol inhaler.   Preventative: CPE through work.  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 Interim medical history since our last visit reviewed. Allergies and medications reviewed and updated. Current Outpatient Prescriptions on File Prior to Visit  Medication Sig  . omega-3 acid ethyl esters (LOVAZA) 1 g capsule Take 4 capsules (4 g total) by mouth daily.   No current facility-administered medications on file prior to visit.    Review of Systems Per HPI unless specifically indicated in ROS section     Objective:    BP 126/86 mmHg  Pulse 84  Temp(Src) 98.5 F (36.9 C) (Oral)  Ht  (1.854 m)  Wt 281 lb 8 oz (127.688 kg)  BMI 37.15 kg/m2  Wt Readings from Last 3 Encounters:  07/17/15 281 lb 8 oz (127.688 kg)  06/26/15 279 lb (126.554 kg)  12/20/13 270 lb 6 oz (122.641 kg)    Physical Exam  Constitutional: He is  oriented to person, place, and time. He appears well-developed and well-nourished. No distress.  HENT:  Head: Normocephalic and atraumatic.  Right Ear: Hearing and external ear normal.  Left Ear: Hearing and external ear normal.  Nose: Nose normal.  Mouth/Throat: Uvula is midline, oropharynx is clear and moist and mucous membranes are normal. No oropharyngeal exudate, posterior oropharyngeal edema or posterior oropharyngeal erythema.  Eyes: Conjunctivae and EOM are normal. Pupils are equal, round, and reactive to light. No scleral icterus.  Neck: Normal range of motion. Neck supple.  Cardiovascular: Normal rate, regular rhythm, normal heart sounds and intact distal pulses.   No murmur heard. Pulses:      Radial pulses are 2+ on the right side, and 2+ on the left side.  Pulmonary/Chest: Effort normal and breath sounds normal. No respiratory distress. He has no wheezes. He has no rales.  Musculoskeletal: Normal range of motion. He exhibits no edema.  Lymphadenopathy:    He has no cervical adenopathy.  Neurological: He is alert and oriented to person, place, and time.  CN grossly intact, station and gait intact  Skin: Skin is warm and dry. No rash noted.  Psychiatric: He has a normal mood and affect. His behavior is normal. Judgment and thought content normal.  Nursing note and vitals reviewed.      Assessment & Plan:   Problem List Items Addressed This Visit    Thyroid  disorder - Primary    +fmhx. H/o abnormal TFTs Has seen endo. Will need routine TFT screening      Relevant Orders   TSH   T3   T4, free   Obesity, Class II, BMI 35-39.9, with comorbidity (HCC)    Discussed healthy diet and lifestyle changes to affect sustainable weight loss.      Fatty liver    Pending CMP by cards. Continue to monitor. Pt aware of weight loss and good chol control as treatment of this condition.      Dyslipidemia    Mainly hypertriglyceridemia - lovaza started by cards recently. Upcoming  monitoring labwork.           Follow up plan: Return in about 1 year (around 07/16/2016), or as needed, for annual exam, prior fasting for blood work.

## 2015-07-17 NOTE — Assessment & Plan Note (Signed)
Pending CMP by cards. Continue to monitor. Pt aware of weight loss and good chol control as treatment of this condition.

## 2015-07-17 NOTE — Progress Notes (Signed)
Pre visit review using our clinic review tool, if applicable. No additional management support is needed unless otherwise documented below in the visit note. 

## 2015-07-19 ENCOUNTER — Encounter: Payer: Self-pay | Admitting: *Deleted

## 2015-07-23 ENCOUNTER — Encounter: Payer: Self-pay | Admitting: Family Medicine

## 2015-08-09 ENCOUNTER — Ambulatory Visit: Payer: Managed Care, Other (non HMO) | Admitting: Cardiology

## 2015-08-28 ENCOUNTER — Other Ambulatory Visit (INDEPENDENT_AMBULATORY_CARE_PROVIDER_SITE_OTHER): Payer: Managed Care, Other (non HMO) | Admitting: *Deleted

## 2015-08-28 DIAGNOSIS — E785 Hyperlipidemia, unspecified: Secondary | ICD-10-CM

## 2015-08-28 DIAGNOSIS — R55 Syncope and collapse: Secondary | ICD-10-CM

## 2015-08-28 DIAGNOSIS — Z8249 Family history of ischemic heart disease and other diseases of the circulatory system: Secondary | ICD-10-CM

## 2015-08-28 LAB — LIPID PANEL
Cholesterol: 169 mg/dL (ref 125–200)
HDL: 36 mg/dL — ABNORMAL LOW (ref >=40)
LDL Cholesterol: 94 mg/dL (ref <130)
Total CHOL/HDL Ratio: 4.7 Ratio (ref <=5.0)
Triglycerides: 193 mg/dL — ABNORMAL HIGH (ref <150)
VLDL: 39 mg/dL — ABNORMAL HIGH (ref <30)

## 2015-08-28 LAB — COMPREHENSIVE METABOLIC PANEL
ALT: 133 U/L — ABNORMAL HIGH (ref 9–46)
AST: 50 U/L — ABNORMAL HIGH (ref 10–40)
Albumin: 4.4 g/dL (ref 3.6–5.1)
Alkaline Phosphatase: 95 U/L (ref 40–115)
BUN: 13 mg/dL (ref 7–25)
CO2: 27 mmol/L (ref 20–31)
Calcium: 9 mg/dL (ref 8.6–10.3)
Chloride: 104 mmol/L (ref 98–110)
Creat: 0.99 mg/dL (ref 0.60–1.35)
Glucose, Bld: 109 mg/dL — ABNORMAL HIGH (ref 65–99)
Potassium: 4.2 mmol/L (ref 3.5–5.3)
Sodium: 138 mmol/L (ref 135–146)
Total Bilirubin: 0.6 mg/dL (ref 0.2–1.2)
Total Protein: 6.6 g/dL (ref 6.1–8.1)

## 2015-08-28 LAB — TSH: TSH: 2.33 mIU/L (ref 0.40–4.50)

## 2015-08-28 NOTE — Addendum Note (Signed)
Addended by: Tonita PhoenixBOWDEN, Yaniv Lage K on: 08/28/2015 08:45 AM   Modules accepted: Orders

## 2015-08-28 NOTE — Addendum Note (Signed)
Addended by: Eeva Schlosser K on: 08/28/2015 08:45 AM   Modules accepted: Orders  

## 2015-08-30 ENCOUNTER — Telehealth: Payer: Self-pay | Admitting: *Deleted

## 2015-08-30 DIAGNOSIS — R7989 Other specified abnormal findings of blood chemistry: Secondary | ICD-10-CM

## 2015-08-30 DIAGNOSIS — R945 Abnormal results of liver function studies: Principal | ICD-10-CM

## 2015-08-30 NOTE — Telephone Encounter (Signed)
-----   Message from Lars MassonKatarina H Nelson, MD sent at 08/30/2015  1:43 PM EDT ----- Please discontinue lovaza and check LFTs in 4 weeks. Thank you, K  ----- Message -----    From: Evie LacksSally R Earl, [facility]    Sent: 08/30/2015   1:33 PM      To: Lars MassonKatarina H Nelson, MD, Loa SocksIvy M Martin, LPN  There are a few case reports of elevated LFTs on Lovaza but not sure if there is a causation or not.  Would stop Lovaza and recheck in 2-4 weeks.  If LFTs still elevated then would refer to GI for other cause.

## 2015-08-30 NOTE — Telephone Encounter (Signed)
Notified the pt that per Dr Delton SeeNelson, his lab results showed that his LFTs are still elevated, and she recommends that we discontinue his Lovaza and recheck LFTs in 4 weeks.  Discontinue lovaza in the pts med list.  Scheduled the pt a lab appt for 09/27/15 at our office to reassess LFTs.  Pt verbalized understanding and agrees with this plan.

## 2015-09-27 ENCOUNTER — Other Ambulatory Visit: Payer: Managed Care, Other (non HMO)

## 2016-02-21 ENCOUNTER — Telehealth: Payer: Self-pay | Admitting: Family Medicine

## 2016-02-21 NOTE — Telephone Encounter (Signed)
Noted. Tetanus UTD per pt 07/2015, last in our chart Tdap 2010.

## 2016-02-21 NOTE — Telephone Encounter (Signed)
Patient Name: Antonio Ferguson  DOB: 1975/09/04    Initial Comment Husband just stepped on a nail, the nail was cleaned and had a tetanus shot in March, wants to make sure there is nothing else to do    Nurse Assessment  Nurse: Scarlette Ar, RN, Herbert Seta Date/Time (Eastern Time): 02/21/2016 11:25:57 AM  Confirm and document reason for call. If symptomatic, describe symptoms. You must click the next button to save text entered. ---Husband just stepped on a nail, the nail was cleaned and had a tetanus shot in March, wants to make sure there is nothing else to do  Has the patient traveled out of the country within the last 30 days? ---Not Applicable  Does the patient have any new or worsening symptoms? ---Yes  Will a triage be completed? ---Yes  Related visit to physician within the last 2 weeks? ---No  Does the PT have any chronic conditions? (i.e. diabetes, asthma, etc.) ---Yes  List chronic conditions. ---See MR  Is this a behavioral health or substance abuse call? ---No     Guidelines    Guideline Title Affirmed Question Affirmed Notes  Puncture Wound [1] Puncture wound of foot AND [2] puncture went through shoe (e.g., tennis shoe)    Final Disposition User   See PCP When Office is Open (within 3 days) Standifer, RN, [employer]    Referrals  GO TO FACILITY REFUSED   Disagree/Comply: Disagree  Disagree/Comply Reason: Disagree with instructions

## 2016-02-21 NOTE — Telephone Encounter (Signed)
Unable to reach pt by phone.

## 2016-02-21 NOTE — Telephone Encounter (Signed)
Pt called back; pt stepped on nail one hr ago. Nail was clean and not rusty and pt had tetanus shot in March or April 2017. Area was cleaned. Pt does not want to come in for appt. Advised pt any signs of infection (redness,swelling,pain, drainage or red streaks) to cb. Pt voiced understanding.

## 2016-04-18 ENCOUNTER — Other Ambulatory Visit: Payer: Self-pay | Admitting: Neurological Surgery

## 2016-04-18 DIAGNOSIS — M545 Low back pain: Secondary | ICD-10-CM

## 2016-04-25 ENCOUNTER — Ambulatory Visit
Admission: RE | Admit: 2016-04-25 | Discharge: 2016-04-25 | Disposition: A | Discharge status: Home / Self Care | Payer: Managed Care, Other (non HMO) | Source: Ambulatory Visit | Attending: Neurological Surgery | Admitting: Neurological Surgery

## 2016-04-25 DIAGNOSIS — M545 Low back pain: Secondary | ICD-10-CM

## 2017-07-08 ENCOUNTER — Ambulatory Visit: Payer: Self-pay | Admitting: *Deleted

## 2017-07-08 NOTE — Telephone Encounter (Signed)
Pt has 30' appt with DR G on 07/09/17 at 11:30.

## 2017-07-08 NOTE — Telephone Encounter (Signed)
Pt's wife called stating her husband was having a lot of issues, pain in his right heel, right jaw, swollen lymph node on the right and he is seeing floaters. Pt was triaged for the pain in his heel. He stated that this was the worst one of all above. Pain started a couple of months ago and now it is an 8 when he is up and walking. Not sure what's causing it. No fevers or swollen areas in his foot or ankle. Appointment made per protocol to see pcp in 3 days.  Pt was triaged for the swollen lymph node on the right, the size of a marble. He has pain in the  right jaw.  He denies fever or any cold symptoms. This developed yesterday. Some discomforts when he is swallowing cold things mostly.  No other swollen lymph nodes noted. Per protocol he should have an appointment in 24 hours. He has one for tomorrow.  Pt c/o floaters. He denies seeing bright lights. He is seeing spots that have come across his eyes over the last 2 to 3 weeks.  Advised him to talk with his eye doctor regarding this.  Reason for Disposition . [1] MODERATE pain (e.g., interferes with normal activities, limping) AND [2] present > 3 days . [1] Single large node AND [2] size > 1 inch (2.5 cm) AND [3] no fever  Answer Assessment - Initial Assessment Questions 1. ONSET: "When did the pain start?"      A couple of months 2. LOCATION: "Where is the pain located?"  Right heel  3. PAIN: "How bad is the pain?"    (Scale 1-10; or mild, moderate, severe)   -  MILD (1-3): doesn't interfere with normal activities    -  MODERATE (4-7): interferes with normal activities (e.g., work or school) or awakens from sleep, limping    -  SEVERE (8-10): excruciating pain, unable to do any normal activities, unable to walk     #8 when walking and no pain when sitting 4. WORK OR EXERCISE: "Has there been any recent work or exercise that involved this part of the body?"      no 5. CAUSE: "What do you think is causing the foot pain?"     Not sure 6.  OTHER SYMPTOMS: "Do you have any other symptoms?" (e.g., leg pain, rash, fever, numbness)     no 7. PREGNANCY: "Is there any chance you are pregnant?" "When was your last menstrual period?"     no  Answer Assessment - Initial Assessment Questions 1. LOCATION: "Where is the swollen node located?" "Is the matching node on the other side of the body also swollen?"      Right side 2. SIZE: "How big is the node?" (Inches or centimeters) (or compare to common objects such as pea, bean, marble, golf ball)      marble 3. ONSET: "When did the swelling start?"      yesterday 4. NECK NODES: "Is there a sore throat, runny nose or other symptoms of a cold?"      no 5. GROIN OR ARMPIT NODES: "Is there a sore, scratch, cut or painful red area on that arm or leg?"      no 6. FEVER: "Do you have a fever?" If so, ask: "What is it, how was it measured, and when did it start?"      no 7. CAUSE: "What do you think is causing the swollen lymph nodes?"     Not sure 8.  OTHER SYMPTOMS: "Do you have any other symptoms?"     Right jaw pain, # 1or2. Swallowing on the right side an drinking cold stuff is is discomfort 9. PREGNANCY: "Is there any chance you are pregnant?" "When was your last menstrual period?"     no  Protocols used: FOOT PAIN-A-AH, LYMPH NODES SWOLLEN-A-AH

## 2017-07-09 ENCOUNTER — Encounter: Payer: Self-pay | Admitting: Family Medicine

## 2017-07-09 ENCOUNTER — Ambulatory Visit (INDEPENDENT_AMBULATORY_CARE_PROVIDER_SITE_OTHER)
Admission: RE | Admit: 2017-07-09 | Discharge: 2017-07-09 | Disposition: A | Discharge status: Home / Self Care | Payer: Managed Care, Other (non HMO) | Source: Ambulatory Visit | Attending: Family Medicine | Admitting: Family Medicine

## 2017-07-09 ENCOUNTER — Ambulatory Visit: Payer: Managed Care, Other (non HMO) | Admitting: Family Medicine

## 2017-07-09 VITALS — BP 132/82 | HR 87 | Temp 98.5°F | Wt 287.0 lb

## 2017-07-09 DIAGNOSIS — R591 Generalized enlarged lymph nodes: Secondary | ICD-10-CM | POA: Diagnosis not present

## 2017-07-09 DIAGNOSIS — M79671 Pain in right foot: Secondary | ICD-10-CM

## 2017-07-09 NOTE — Patient Instructions (Signed)
For heel - xray today rule out heel bone fracture or bone spur. I think you have plantar fasciitis - treat with heel inserts into shoes, wear supportive shoes, frozen water bottle massage and stretching at night time, and course of anti inflammatory sent to pharmacy (naprosyn 500mg  twice daily with meals for 1 week then as needed).  For R swollen lymph node - no signs of bacterial infection today - possibly early developing viral infection - watch for other symptoms. If persistent past 3 weeks, let me know for labs and possibly imaging.

## 2017-07-09 NOTE — Progress Notes (Signed)
BP 132/82 (BP Location: Left Arm, Patient Position: Sitting, Cuff Size: Large)   Pulse 87   Temp 98.5 F (36.9 C) (Oral)   Wt 287 lb (130.2 kg) Comment: Wearing work gear  SpO2 97%   BMI 37.87 kg/m    CC: heel pain, swollen lymph node Subjective:    Patient ID: Antonio Ferguson, male    DOB: 01-May-1976, 42 y.o.   MRN: [id number]  HPI: Antonio Ferguson is a 42 y.o. male presenting on 07/09/2017 for Foot Pain (Located in right heel. Started several months ago. ) and Facial Swelling (Has swelling on right side of neck and around ear. Noticed yesterday. )   "Antonio Ferguson"  R medial heel pain that started several months ago, progressively worsening. Worse with increased activity. Hasn't tried anything for this. First few steps of the day are the worst.   R facial ache noted yesterday from ear to jaw, RN wife noticed R swollen gland as well. No dental pain, no ear pain. Sees dentist Q6 months (last seen 02/2017). Denies congestion, RN, sore throat, cough, fevers/chills.   H/o thyroglossal duct cyst s/p excision 2015.   Noticing worsening floaters without other vision changes or photopsia. Had eye exam this morning, R eye dilated.   Relevant past medical, surgical, family and social history reviewed and updated as indicated. Interim medical history since our last visit reviewed. Allergies and medications reviewed and updated. No outpatient medications prior to visit.   No facility-administered medications prior to visit.      Per HPI unless specifically indicated in ROS section below Review of Systems     Objective:    BP 132/82 (BP Location: Left Arm, Patient Position: Sitting, Cuff Size: Large)   Pulse 87   Temp 98.5 F (36.9 C) (Oral)   Wt 287 lb (130.2 kg) Comment: Wearing work gear  SpO2 97%   BMI 37.87 kg/m   Wt Readings from Last 3 Encounters:  07/09/17 287 lb (130.2 kg)  07/17/15 281 lb 8 oz (127.7 kg)  06/26/15 279 lb (126.6 kg)    Physical Exam  Constitutional:  He appears well-developed and well-nourished. No distress.  HENT:  Head: Normocephalic and atraumatic.  Right Ear: Hearing, tympanic membrane, external ear and ear canal normal.  Left Ear: Hearing, tympanic membrane, external ear and ear canal normal.  Nose: Nose normal.  Mouth/Throat: Uvula is midline, oropharynx is clear and moist and mucous membranes are normal. No oropharyngeal exudate, posterior oropharyngeal edema, posterior oropharyngeal erythema or tonsillar abscesses.  No dental pain to palpation No mass of parotid or salivary glands appreciated Canals clear, TMs pearly grey with good light reflex No TMJ asymmetry or pain  Eyes: Conjunctivae and EOM are normal. No scleral icterus.  Dilated R eye from eye exam this morning  Neck: Normal range of motion. Neck supple. No thyromegaly present.  Musculoskeletal: Normal range of motion.  R foot 2+ DP/PT Point tender to palpation lower medial calcaneus No pain at achilles tendon or insertion  No ligament laxity, neg calcaneal squeeze test No significant pain at plantar fascia  Lymphadenopathy:       Head (right side): Tonsillar (mobile, nontender, ~1cm) adenopathy present. No submental, no submandibular, no preauricular and no posterior auricular adenopathy present.       Head (left side): No submental, no submandibular, no tonsillar, no preauricular and no posterior auricular adenopathy present.    He has no cervical adenopathy.       Right: No supraclavicular adenopathy present.  Left: No supraclavicular adenopathy present.  Neurological: He is alert.  Skin: Skin is warm and dry. No rash noted.  Nursing note and vitals reviewed.     Assessment & Plan:   Problem List Items Addressed This Visit    Lymphadenopathy of head and neck    No localizing signs or symptoms. Dental exam benign. Non smoker, social alcohol use. Will monitor for now. Rx naprosyn for 1 wk. If persistent past 2-3 wks, return for labs (CBC, ESR) and consider  further HEENT imaging.       Pain of right heel - Primary    Calcaneal spur vs plantar fasciitis.  Xray today to r/o calcaneal fracture, eval spur.  Will Rx as plantar fasciitis, reviewed pathophysiology, rec gel heel lifts, supportive shoe wear, frozen water bottle massage and stretching at night time, Rx NSAID course. Handout provided.  Update if not improving, discussed podiatry referral.       Relevant Orders   DG Foot Complete Right       No orders of the defined types were placed in this encounter.  Orders Placed This Encounter  Procedures  . DG Foot Complete Right    Standing Status:   Future    Number of Occurrences:   1    Standing Expiration Date:   09/07/2018    Order Specific Question:   Reason for Exam (SYMPTOM  OR DIAGNOSIS REQUIRED)    Answer:   R heel pain x 3 months    Order Specific Question:   Preferred imaging location?    Answer:   [facility]    Order Specific Question:   Radiology Contrast Protocol - do NOT remove file path    Answer:   [email or web address]    Follow up plan: No Follow-up on file.  Antonio Bush, MD

## 2017-07-09 NOTE — Assessment & Plan Note (Signed)
Calcaneal spur vs plantar fasciitis.  Xray today to r/o calcaneal fracture, eval spur.  Will Rx as plantar fasciitis, reviewed pathophysiology, rec gel heel lifts, supportive shoe wear, frozen water bottle massage and stretching at night time, Rx NSAID course. Handout provided.  Update if not improving, discussed podiatry referral.

## 2017-07-09 NOTE — Assessment & Plan Note (Signed)
No localizing signs or symptoms. Dental exam benign. Non smoker, social alcohol use. Will monitor for now. Rx naprosyn for 1 wk. If persistent past 2-3 wks, return for labs (CBC, ESR) and consider further HEENT imaging.  

## 2017-07-21 ENCOUNTER — Ambulatory Visit: Payer: Managed Care, Other (non HMO) | Admitting: Family Medicine

## 2017-07-21 ENCOUNTER — Encounter: Payer: Self-pay | Admitting: Family Medicine

## 2017-07-21 VITALS — BP 120/84 | HR 79 | Temp 98.3°F | Wt 278.0 lb

## 2017-07-21 DIAGNOSIS — K760 Fatty (change of) liver, not elsewhere classified: Secondary | ICD-10-CM

## 2017-07-21 DIAGNOSIS — E079 Disorder of thyroid, unspecified: Secondary | ICD-10-CM | POA: Diagnosis not present

## 2017-07-21 DIAGNOSIS — E785 Hyperlipidemia, unspecified: Secondary | ICD-10-CM | POA: Diagnosis not present

## 2017-07-21 DIAGNOSIS — R591 Generalized enlarged lymph nodes: Secondary | ICD-10-CM

## 2017-07-21 LAB — CBC WITH DIFFERENTIAL/PLATELET
BASOS ABS: 0 10*3/uL (ref 0.0–0.1)
Basophils Relative: 0.6 % (ref 0.0–3.0)
EOS PCT: 2.6 % (ref 0.0–5.0)
Eosinophils Absolute: 0.2 10*3/uL (ref 0.0–0.7)
HCT: 45 % (ref 39.0–52.0)
Hemoglobin: 15.8 g/dL (ref 13.0–17.0)
LYMPHS PCT: 35.1 % (ref 12.0–46.0)
Lymphs Abs: 2.1 10*3/uL (ref 0.7–4.0)
MCHC: 35.1 g/dL (ref 30.0–36.0)
MCV: 86.9 fl (ref 78.0–100.0)
MONOS PCT: 7.6 % (ref 3.0–12.0)
Monocytes Absolute: 0.4 10*3/uL (ref 0.1–1.0)
NEUTROS ABS: 3.2 10*3/uL (ref 1.4–7.7)
Neutrophils Relative %: 54.1 % (ref 43.0–77.0)
PLATELETS: 202 10*3/uL (ref 150.0–400.0)
RBC: 5.17 Mil/uL (ref 4.22–5.81)
RDW: 12.8 % (ref 11.5–15.5)
WBC: 5.9 10*3/uL (ref 4.0–10.5)

## 2017-07-21 LAB — COMPREHENSIVE METABOLIC PANEL
ALK PHOS: 89 U/L (ref 39–117)
ALT: 75 U/L — ABNORMAL HIGH (ref 0–53)
AST: 30 U/L (ref 0–37)
Albumin: 4.3 g/dL (ref 3.5–5.2)
BUN: 13 mg/dL (ref 6–23)
CHLORIDE: 103 meq/L (ref 96–112)
CO2: 28 mEq/L (ref 19–32)
Calcium: 9.8 mg/dL (ref 8.4–10.5)
Creatinine, Ser: 0.98 mg/dL (ref 0.40–1.50)
GFR: 90.33 mL/min (ref >60.00)
GLUCOSE: 114 mg/dL — AB (ref 70–99)
POTASSIUM: 4.2 meq/L (ref 3.5–5.1)
SODIUM: 136 meq/L (ref 135–145)
TOTAL PROTEIN: 7 g/dL (ref 6.0–8.3)
Total Bilirubin: 0.5 mg/dL (ref 0.2–1.2)

## 2017-07-21 LAB — LIPID PANEL
CHOL/HDL RATIO: 6
CHOLESTEROL: 196 mg/dL (ref 0–200)
HDL: 34.1 mg/dL — AB (ref >39.00)

## 2017-07-21 LAB — TSH: TSH: 2.41 u[IU]/mL (ref 0.35–4.50)

## 2017-07-21 LAB — T4, FREE: Free T4: 0.68 ng/dL (ref 0.60–1.60)

## 2017-07-21 LAB — LDL CHOLESTEROL, DIRECT: Direct LDL: 68 mg/dL

## 2017-07-21 NOTE — Assessment & Plan Note (Signed)
S/p liver biopsy

## 2017-07-21 NOTE — Progress Notes (Signed)
BP 120/84 (BP Location: Left Arm, Patient Position: Sitting, Cuff Size: Large)   Pulse 79   Temp 98.3 F (36.8 C) (Oral)   Wt 278 lb (126.1 kg)   SpO2 97%   BMI 36.68 kg/m    CC: ST Subjective:    Patient ID: Antonio Ferguson, male    DOB: 12-Aug-1975, 42 y.o.   MRN: [id number]  HPI: Antonio Ferguson is a 42 y.o. male presenting on 07/21/2017 for Facial Swelling (Pt was seen a couple of wks ago due to right side gland swelling and jaw pain. Was also seen by dentist. Denies anymore pain and swelling has improved, even sinces scheduling OV but just wants to be sure everything is ok.  Was told to let Dr. Reece Agar know if not better in a couple of wks. )   See prior note. Last visit (2 wks ago) had R tonsillar enlarged lymph node, nontender. Treated with naprosyn x 1 wk. Also saw dentist with benign exam. He didn't have URI sxs at that time.   Jaw achiness is improving - but notes persistent swelling at ankle of jaw.  No relation of pain or swelling to food.  Still no fevers, rhinorrhea, or cough.  Both children sick at home.  NSAIDs help.  Bought new shoes with orthotics - improving.   Relevant past medical, surgical, family and social history reviewed and updated as indicated. Interim medical history since our last visit reviewed. Allergies and medications reviewed and updated. No outpatient medications prior to visit.   No facility-administered medications prior to visit.      Per HPI unless specifically indicated in ROS section below Review of Systems     Objective:    BP 120/84 (BP Location: Left Arm, Patient Position: Sitting, Cuff Size: Large)   Pulse 79   Temp 98.3 F (36.8 C) (Oral)   Wt 278 lb (126.1 kg)   SpO2 97%   BMI 36.68 kg/m   Wt Readings from Last 3 Encounters:  07/21/17 278 lb (126.1 kg)  07/09/17 287 lb (130.2 kg)  07/17/15 281 lb 8 oz (127.7 kg)    Physical Exam  Constitutional: He appears well-developed and well-nourished. No distress.  HENT:    Head: Normocephalic and atraumatic.  Right Ear: Hearing, tympanic membrane, external ear and ear canal normal.  Left Ear: Hearing, tympanic membrane, external ear and ear canal normal.  Nose: Nose normal. No mucosal edema or rhinorrhea. Right sinus exhibits no maxillary sinus tenderness and no frontal sinus tenderness. Left sinus exhibits no maxillary sinus tenderness and no frontal sinus tenderness.  Mouth/Throat: Uvula is midline, oropharynx is clear and moist and mucous membranes are normal. No oropharyngeal exudate, posterior oropharyngeal edema, posterior oropharyngeal erythema or tonsillar abscesses.  Non-obstructed stensen's gland  Eyes: Conjunctivae and EOM are normal. Pupils are equal, round, and reactive to light. No scleral icterus.  Neck: Normal range of motion. Neck supple. No thyromegaly present.  Lymphadenopathy:       Head (right side): Tonsillar (nontender) adenopathy present. No submental, no submandibular, no preauricular and no posterior auricular adenopathy present.       Head (left side): No submental, no submandibular, no tonsillar, no preauricular and no posterior auricular adenopathy present.    He has no cervical adenopathy.       Right: No supraclavicular adenopathy present.       Left: No supraclavicular adenopathy present.  Salivary gland swelling vs lymph node swelling R tonsillar area  Skin: Skin is warm and  dry. No rash noted.  Nursing note and vitals reviewed.  Results for orders placed or performed in visit on 08/28/15  Comprehensive metabolic panel  Result Value Ref Range   Sodium 138 135 - 146 mmol/L   Potassium 4.2 3.5 - 5.3 mmol/L   Chloride 104 98 - 110 mmol/L   CO2 27 20 - 31 mmol/L   Glucose, Bld 109 (H) 65 - 99 mg/dL   BUN 13 7 - 25 mg/dL   Creat 5.360.99 6.440.60 - 0.341.35 mg/dL   Total Bilirubin 0.6 0.2 - 1.2 mg/dL   Alkaline Phosphatase 95 40 - 115 U/L   AST 50 (H) 10 - 40 U/L   ALT 133 (H) 9 - 46 U/L   Total Protein 6.6 6.1 - 8.1 g/dL   Albumin  4.4 3.6 - 5.1 g/dL   Calcium 9.0 8.6 - 74.210.3 mg/dL  Lipid panel  Result Value Ref Range   Cholesterol 169 125 - 200 mg/dL   Triglycerides 595193 (H) <150 mg/dL   HDL 36 (L) >=63>=40 mg/dL   Total CHOL/HDL Ratio 4.7 <=5.0 Ratio   VLDL 39 (H) <30 mg/dL   LDL Cholesterol 94 <875<130 mg/dL  TSH  Result Value Ref Range   TSH 2.33 0.40 - 4.50 mIU/L      Assessment & Plan:   Problem List Items Addressed This Visit    Dyslipidemia    Update FLP. H/o hypertriglyceridemia. Fasting today.       Relevant Orders   Lipid panel   Fatty liver    S/p liver biopsy      Relevant Orders   Comprehensive metabolic panel   Lymphadenopathy of head and neck - Primary    Reassuring recent dental evaluation. Persistent LAD, slightly smaller, vs possibly inflamed salivary gland. Possibly still viral (children sick at home recently). Will check labs today - CBC, CMP. If persistent past 2 wks, will check neck US. Pt agrees with plan.       Relevant Orders   CBC with Differential/Platelet   Thyroid disorder    Update TFTs       Relevant Orders   TSH   T4, free       No orders of the defined types were placed in this encounter.  Orders Placed This Encounter  Procedures  . Lipid panel  . Comprehensive metabolic panel  . TSH  . CBC with Differential/Platelet  . T4, free    Follow up plan: No Follow-up on file.  [name], MD

## 2017-07-21 NOTE — Assessment & Plan Note (Addendum)
Update FLP. H/o hypertriglyceridemia. Fasting today.

## 2017-07-21 NOTE — Assessment & Plan Note (Addendum)
Update TFTs ° °

## 2017-07-21 NOTE — Assessment & Plan Note (Signed)
Reassuring recent dental evaluation. Persistent LAD, slightly smaller, vs possibly inflamed salivary gland. Possibly still viral (children sick at home recently). Will check labs today - CBC, CMP. If persistent past 2 wks, will check neck US. Pt agrees with plan.

## 2017-07-21 NOTE — Patient Instructions (Addendum)
Labs today for persistent swelling - could still be viral.  Continue to watch - expect improvement each day. If persistent after another 2 weeks, let me know for neck ultrasound.

## 2017-07-22 ENCOUNTER — Encounter: Payer: Self-pay | Admitting: Family Medicine

## 2018-01-15 ENCOUNTER — Other Ambulatory Visit: Payer: Self-pay | Admitting: Orthopedic Surgery

## 2018-01-15 DIAGNOSIS — M67922 Unspecified disorder of synovium and tendon, left upper arm: Secondary | ICD-10-CM

## 2018-01-15 DIAGNOSIS — M25522 Pain in left elbow: Secondary | ICD-10-CM

## 2018-01-22 ENCOUNTER — Ambulatory Visit
Admission: RE | Admit: 2018-01-22 | Discharge: 2018-01-22 | Disposition: A | Discharge status: Home / Self Care | Payer: Managed Care, Other (non HMO) | Source: Ambulatory Visit | Attending: Orthopedic Surgery | Admitting: Orthopedic Surgery

## 2018-01-22 DIAGNOSIS — M25522 Pain in left elbow: Secondary | ICD-10-CM

## 2018-01-22 DIAGNOSIS — M67922 Unspecified disorder of synovium and tendon, left upper arm: Secondary | ICD-10-CM

## 2018-03-03 ENCOUNTER — Ambulatory Visit: Payer: Managed Care, Other (non HMO) | Admitting: Family Medicine

## 2018-03-03 ENCOUNTER — Encounter: Payer: Self-pay | Admitting: Family Medicine

## 2018-03-03 VITALS — BP 118/76 | HR 79 | Temp 98.5°F | Ht 73.0 in | Wt 285.0 lb

## 2018-03-03 DIAGNOSIS — K219 Gastro-esophageal reflux disease without esophagitis: Secondary | ICD-10-CM | POA: Insufficient documentation

## 2018-03-03 DIAGNOSIS — R41840 Attention and concentration deficit: Secondary | ICD-10-CM | POA: Insufficient documentation

## 2018-03-03 DIAGNOSIS — Z23 Encounter for immunization: Secondary | ICD-10-CM | POA: Diagnosis not present

## 2018-03-03 MED ORDER — OMEPRAZOLE 40 MG PO CPDR: DELAYED_RELEASE_CAPSULE | ORAL | 2 refills | Status: DC

## 2018-03-03 MED ORDER — ATOMOXETINE HCL 40 MG PO CAPS: 40.0000 mg | ORAL_CAPSULE | Freq: Every day | ORAL | 3 refills | Status: DC

## 2018-03-03 NOTE — Patient Instructions (Addendum)
Flu shot today GERD precautions: Head of bed elevated. Avoidance of citrus, fatty foods, chocolate, peppermint, and excessive alcohol, along with sodas, orange juice (acidic drinks) At least a few hours between dinner and bed, minimize naps after eating. Take omeprazole 40mg  daily for 3 weeks then as needed.  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 Update me with effect after 3-4 weeks, let me know sooner if not tolerated or unaffordable.

## 2018-03-03 NOTE — Assessment & Plan Note (Signed)
Meets criteria for ADD - reviewed treatment options including stimulants and non stimulants. Offered psychological referral for formal testing. Pt not interested in stimulants at this time. Reviewed wellbutrin vs strattera as well as side effects and mechanism of action - he would like to price out strattera - will send 40mg  dose to pharmacy. Pt will update me in 1 month with effect.

## 2018-03-03 NOTE — Assessment & Plan Note (Signed)
Deteriorated recently - attributed to poor diet choices.  No red flags.  Will Rx omeprazole 40mg  daily x 3 wks, then PRN.  Reviewed lifestyle changes to improve symptoms as well.

## 2018-03-03 NOTE — Progress Notes (Signed)
BP 118/76 (BP Location: Left Arm, Patient Position: Sitting, Cuff Size: Large)   Pulse 79   Temp 98.5 F (36.9 C) (Oral)   Ht 6\' 1"  (1.854 m)   Wt 285 lb (129.3 kg)   SpO2 97%   BMI 37.60 kg/m    CC: GERD, attention issues Subjective:    Patient ID: Antonio Ferguson, male    DOB: 11/22/1975, 42 y.o.   MRN: [id number]  HPI: Antonio Ferguson is a 42 y.o. male presenting on 03/03/2018 for Gastroesophageal Reflux (C/o severe reflux more often. Has feeling of hot lava in his throat. Using more Tums than usually. Pt accompanied by his wife. ) and Focus Issues (Per wife, pt is having trouble staying focused. Easily gets off task. Per pt, started yrs ago but has worsened in past yr. )   GERD - prior rarely bothered him, now noticing this more frequently and more severe symptoms. This has been noticed over the past month. Increased tums use. Previously used prevacid. Diet has deteriorated some. Denies nausea/vomiting, dysphagia, unexpected weight loss, abd pain.   Inattention present since childhood. Notes difficulty focusing, trouble staying on task, trouble at home and at work. Easily distracted with conversation. May have increasing difficulty with hearing - more trouble at work. 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 Average performance during grade school. Does enjoy wood working as hobby.   Pt endorses persistent pattern of inattention and or hyperactivity/impulsivity that interferes with daily functioning, ongoing for 6 months. Symptoms present in 2 or more settings. Symptoms present before 40 years of age? YES Inattention (6+, 6 months): Careless mistakes? yes Difficulty sustaining attention? yes Doesn't listen when spoken to directly? yes Lacks follow through with instructions or difficulty completing tasks? yes Difficulty organizing tasks/activities? yes Avoiding tasks that require sustained attention? yes Easily losing things needed for tasks? yes Easily distracted  by external stimuli? yes Forgetful in daily activities? yes Hyperactive/impulsive (6+, 6 months): Fidgeting, tapping hands/feet? yes Leaves seat when expected to remain in seat? no Feeling restless, or running around when not expected? yes Unable to engage in leisurely activities quietly? no Always on the go, "driven by a motor"? yes Excessive talking? yes Blurting out answer, responds to other's questions? no Difficulty waiting in line or waiting turn? no Interrupting or intruding on others? no  Relevant past medical, surgical, family and social history reviewed and updated as indicated. Interim medical history since our last visit reviewed. Allergies and medications reviewed and updated. No outpatient medications prior to visit.   No facility-administered medications prior to visit.      Per HPI unless specifically indicated in ROS section below Review of Systems     Objective:    BP 118/76 (BP Location: Left Arm, Patient Position: Sitting, Cuff Size: Large)   Pulse 79   Temp 98.5 F (36.9 C) (Oral)   Ht 6\' 1"  (1.854 m)   Wt 285 lb (129.3 kg)   SpO2 97%   BMI 37.60 kg/m   Wt Readings from Last 3 Encounters:  03/03/18 285 lb (129.3 kg)  07/21/17 278 lb (126.1 kg)  07/09/17 287 lb (130.2 kg)    Physical Exam  Constitutional: He appears well-developed and well-nourished. No distress.  HENT:  Mouth/Throat: Oropharynx is clear and moist. No oropharyngeal exudate.  Cardiovascular: Normal rate, regular rhythm and normal heart sounds.  No murmur heard. Pulmonary/Chest: Effort normal and breath sounds normal. No respiratory distress. He has no wheezes. He has no rales.  Psychiatric:  He has a normal mood and affect. His behavior is normal. Judgment and thought content normal.  Nursing note and vitals reviewed.     Assessment & Plan:   Problem List Items Addressed This Visit    Inattention    Meets criteria for ADD - reviewed treatment options including stimulants and  non stimulants. Offered psychological referral for formal testing. Pt not interested in stimulants at this time. Reviewed wellbutrin vs strattera as well as side effects and mechanism of action - he would like to price out strattera - will send 40mg  dose to pharmacy. Pt will update me in 1 month with effect.       GERD (gastroesophageal reflux disease) - Primary    Deteriorated recently - attributed to poor diet choices.  No red flags.  Will Rx omeprazole 40mg  daily x 3 wks, then PRN.  Reviewed lifestyle changes to improve symptoms as well.       Relevant Medications   omeprazole (PRILOSEC) 40 MG capsule    Other Visit Diagnoses    Need for influenza vaccination       Relevant Orders   Flu Vaccine QUAD 36+ mos IM (Completed)       Meds ordered this encounter  Medications  . omeprazole (PRILOSEC) 40 MG capsule    Sig: Take one tablet daily for 3 weeks then as needed    Dispense:  30 capsule    Refill:  2  . atomoxetine (STRATTERA) 40 MG capsule    Sig: Take 1 capsule (40 mg total) by mouth daily.    Dispense:  30 capsule    Refill:  3   Orders Placed This Encounter  Procedures  . Flu Vaccine QUAD 36+ mos IM    Follow up plan: No follow-ups on file.  Eustaquio Boyden, MD

## 2018-06-26 ENCOUNTER — Encounter: Payer: Self-pay | Admitting: Family Medicine

## 2018-06-26 ENCOUNTER — Ambulatory Visit: Payer: Managed Care, Other (non HMO) | Admitting: Family Medicine

## 2018-06-26 VITALS — BP 136/80 | HR 86 | Temp 98.6°F | Ht 73.0 in | Wt 287.5 lb

## 2018-06-26 DIAGNOSIS — B9789 Other viral agents as the cause of diseases classified elsewhere: Secondary | ICD-10-CM | POA: Diagnosis not present

## 2018-06-26 DIAGNOSIS — J069 Acute upper respiratory infection, unspecified: Secondary | ICD-10-CM | POA: Diagnosis not present

## 2018-06-26 DIAGNOSIS — R05 Cough: Secondary | ICD-10-CM

## 2018-06-26 DIAGNOSIS — R059 Cough, unspecified: Secondary | ICD-10-CM

## 2018-06-26 LAB — POC INFLUENZA A&B (BINAX/QUICKVUE)
Influenza A, POC: NEGATIVE
Influenza B, POC: NEGATIVE

## 2018-06-26 MED ORDER — BENZONATATE 200 MG PO CAPS: 200.0000 mg | ORAL_CAPSULE | Freq: Three times a day (TID) | ORAL | 1 refills | Status: DC | PRN

## 2018-06-26 NOTE — Patient Instructions (Addendum)
Flu test is negative   Drink lots of fluids I like mucinex DM for cough and congestion  Tylenol or ibuprofen for headache/throat pain/fever/chills/aches   Get rest when you can   Fill tessalon for cough if needed

## 2018-06-26 NOTE — Assessment & Plan Note (Signed)
Neg flu screen and no fever (w/o med) this am  Suspect viral /disc symptom management  Expectorant-DM for cough and congestion  Fluids/rest  Acetaminophen/ibuprofen for pain and fever  Px for tessalon printed if needed for cough  Update if not starting to improve in a week or if worsening

## 2018-06-26 NOTE — Progress Notes (Signed)
Subjective:    Patient ID: Antonio Ferguson, male    DOB: Nov 23, 1975, 43 y.o.   MRN: [id number]  HPI Here with uri symptoms and fever   Started feeling poorly last night 7 pm Headache Watery eyes ST Nose is stuffy / with pnd -clear d/c Cough - just started this am /dry cough so far  Chills last night- did not take temp (wife said he was hot to the touch) Some aches    He gets a flu shot   Results for orders placed or performed in visit on 06/26/18  POC Influenza A&B(BINAX/QUICKVUE)  Result Value Ref Range   Influenza A, POC Negative Negative   Influenza B, POC Negative Negative    Patient Active Problem List   Diagnosis Date Noted  . Viral URI with cough 06/26/2018  . GERD (gastroesophageal reflux disease) 03/03/2018  . Inattention 03/03/2018  . Pain of right heel 07/09/2017  . Lymphadenopathy of head and neck 07/09/2017  . Obesity, Class II, BMI 35-39.9, with comorbidity 07/17/2015  . Thyroid disorder   . Dyslipidemia   . Fatty liver    Past Medical History:  Diagnosis Date  . GERD (gastroesophageal reflux disease)    rare  . HLD (hyperlipidemia)   . NAFLD (nonalcoholic fatty liver disease) 7829   s/p [place] and liver biopsy  . OSA on CPAP 02/2014   Dr Su Monks [facility]   . 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 Thyroid disorder    is being monitored for possible Hashimoto's; no current med   Past Surgical History:  Procedure Laterality Date  . COLONOSCOPY  2003   WNL  . LIVER BIOPSY  2003   fatty liver  . THYROGLOSSAL DUCT CYST N/A 12/20/2013   Procedure: EXCISION THYROGLOSSAL DUCT CYST;  Surgeon: Serena Colonel, MD;  Location: [facility];  Service: ENT;  Laterality: N/A;  . VASECTOMY  2009  . WISDOM TOOTH EXTRACTION     Social History   Tobacco Use  . Smoking status: Never Smoker  . Smokeless tobacco: Never Used  Substance Use Topics  . Alcohol use: Yes    Alcohol/week: 0.0 standard drinks    Comment: rare  . Drug use: No   Family  History  Problem Relation Age of Onset  . Hypertension Father   . Hyperlipidemia Father   . Diabetes Father   . Thyroid disease Brother        HYPOTHYROID  . CAD Paternal Uncle        CABG  . Diabetes Paternal Grandfather   . Sudden Cardiac Death Maternal Uncle        and grandfather  . Healthy Mother   . CAD Maternal Uncle        several uncles  . Peripheral Artery Disease Father        iliac stents (smoker)  . Cancer Paternal Grandmother        colon   Allergies  Allergen Reactions  . Oxycodone-Acetaminophen Nausea Only  . Latex Other (See Comments)    BURNING OF FACE IF LATEX TOUCHES FACE   Current Outpatient Medications on File Prior to Visit  Medication Sig Dispense Refill  . omeprazole (PRILOSEC) 40 MG capsule Take one tablet daily for 3 weeks then as needed 30 capsule 2   No current facility-administered medications on file prior to visit.     Review of Systems  Constitutional: Positive for appetite change, fatigue and fever.  HENT: Positive for congestion, postnasal drip, rhinorrhea, sinus  pressure, sneezing and sore throat. Negative for ear pain.   Eyes: Negative for pain and discharge.  Respiratory: Positive for cough. Negative for shortness of breath, wheezing and stridor.   Cardiovascular: Negative for chest pain.  Gastrointestinal: Negative for diarrhea, nausea and vomiting.  Genitourinary: Negative for frequency, hematuria and urgency.  Musculoskeletal: Negative for arthralgias and myalgias.  Skin: Negative for rash.  Neurological: Positive for headaches. Negative for dizziness, weakness and light-headedness.  Psychiatric/Behavioral: Negative for confusion and dysphoric mood.       Objective:   Physical Exam Constitutional:      General: He is not in acute distress.    Appearance: Normal appearance. He is well-developed. He is obese. He is not ill-appearing, toxic-appearing or diaphoretic.  HENT:     Head: Normocephalic and atraumatic.     Comments:  Nares are injected and congested      Right Ear: Tympanic membrane, ear canal and external ear normal.     Left Ear: Tympanic membrane, ear canal and external ear normal.     Nose: Congestion and rhinorrhea present.     Comments: Nares are injected and congested      Mouth/Throat:     Mouth: Mucous membranes are moist.     Pharynx: Oropharynx is clear. No oropharyngeal exudate or posterior oropharyngeal erythema.     Comments: Clear pnd  Eyes:     General:        Right eye: No discharge.        Left eye: No discharge.     Conjunctiva/sclera: Conjunctivae normal.     Pupils: Pupils are equal, round, and reactive to light.  Neck:     Musculoskeletal: Normal range of motion and neck supple.  Cardiovascular:     Rate and Rhythm: Normal rate.     Heart sounds: Normal heart sounds.  Pulmonary:     Effort: Pulmonary effort is normal. No respiratory distress.     Breath sounds: Normal breath sounds. No stridor. No wheezing, rhonchi or rales.     Comments: Good air exch No rales or rhonchi No wheeze even on forced exp Chest:     Chest wall: No tenderness.  Lymphadenopathy:     Cervical: No cervical adenopathy.  Skin:    General: Skin is warm and dry.     Capillary Refill: Capillary refill takes less than 2 seconds.     Findings: No rash.  Neurological:     Mental Status: He is alert.     Cranial Nerves: No cranial nerve deficit.  Psychiatric:        Mood and Affect: Mood normal.           Assessment & Plan:   Problem List Items Addressed This Visit      Respiratory   Viral URI with cough - Primary    Neg flu screen and no fever (w/o med) this am  Suspect viral /disc symptom management  Expectorant-DM for cough and congestion  Fluids/rest  Acetaminophen/ibuprofen for pain and fever  Px for tessalon printed if needed for cough  Update if not starting to improve in a week or if worsening         Other Visit Diagnoses    Cough       Relevant Orders   POC Influenza  A&B(BINAX/QUICKVUE) (Completed)

## 2018-07-16 ENCOUNTER — Other Ambulatory Visit: Payer: Self-pay | Admitting: Family Medicine

## 2018-07-16 ENCOUNTER — Encounter: Payer: Self-pay | Admitting: Family Medicine

## 2018-07-16 DIAGNOSIS — E785 Hyperlipidemia, unspecified: Secondary | ICD-10-CM

## 2018-07-16 DIAGNOSIS — E079 Disorder of thyroid, unspecified: Secondary | ICD-10-CM

## 2018-07-17 ENCOUNTER — Other Ambulatory Visit: Payer: Managed Care, Other (non HMO)

## 2018-07-20 ENCOUNTER — Encounter: Payer: Managed Care, Other (non HMO) | Admitting: Family Medicine

## 2018-10-23 ENCOUNTER — Encounter: Payer: Self-pay | Admitting: Family Medicine

## 2018-10-23 ENCOUNTER — Other Ambulatory Visit: Payer: Self-pay | Admitting: Family Medicine

## 2018-10-23 ENCOUNTER — Ambulatory Visit (INDEPENDENT_AMBULATORY_CARE_PROVIDER_SITE_OTHER): Payer: Managed Care, Other (non HMO) | Admitting: Family Medicine

## 2018-10-23 VITALS — Ht 73.0 in | Wt 265.0 lb

## 2018-10-23 DIAGNOSIS — H60501 Unspecified acute noninfective otitis externa, right ear: Secondary | ICD-10-CM | POA: Diagnosis not present

## 2018-10-23 MED ORDER — NEOMYCIN-POLYMYXIN-HC 3.5-10000-1 OT SOLN: 3.0000 [drp] | Freq: Four times a day (QID) | OTIC | 0 refills | Status: AC

## 2018-10-23 NOTE — Assessment & Plan Note (Signed)
Most likely external ear infection vs external ear pimple/boil.  Treat with topical antibiotics and steroid. Ibuprofen prn.  If not improving as expected pt needs in person exam.

## 2018-10-23 NOTE — Patient Instructions (Signed)
Start topical antibiotic drops in right ear for 5 days.. continue for 10 days if symptoms still present but better. Call if not improving early next week, sooner if fever.   Otitis Externa  Otitis externa is an infection of the outer ear canal. The outer ear canal is the area between the outside of the ear and the eardrum. Otitis externa is sometimes called swimmer's ear. What are the causes? Common causes of this condition include:  Swimming in dirty water.  Moisture in the ear.  An injury to the inside of the ear.  An object stuck in the ear.  A cut or scrape on the outside of the ear. What increases the risk? You are more likely to develop this condition if you go swimming often. What are the signs or symptoms? The first symptom of this condition is often itching in the ear. Later symptoms of the condition include:  Swelling of the ear.  Redness in the ear.  Ear pain. The pain may get worse when you pull on your ear.  Pus coming from the ear. How is this diagnosed? This condition may be diagnosed by examining the ear and testing fluid from the ear for bacteria and funguses. How is this treated? This condition may be treated with:  Antibiotic ear drops.   Medicines to reduce itching and swelling. Follow these instructions at home:  If you were prescribed antibiotic ear drops, use them as told by your health care provider. Do not stop using the antibiotic even if your condition improves.  Take over-the-counter and prescription medicines only as told by your health care provider.  Avoid getting water in your ears as told by your health care provider. This may include avoiding swimming or water sports for a few days.  Keep all follow-up visits as told by your health care provider. This is important. How is this prevented?  Keep your ears dry. Use the corner of a towel to dry your ears after you swim or bathe.  Avoid scratching or putting things in your ear. Doing  these things can damage the ear canal or remove the protective wax that lines it, which makes it easier for bacteria and funguses to grow.  Avoid swimming in lakes, polluted water, or pools that may not have enough chlorine. Contact a health care provider if:  You have a fever.  Your ear is still red, swollen, painful, or draining pus after 3 days.  Your redness, swelling, or pain gets worse.  You have a severe headache.  You have redness, swelling, pain, or tenderness in the area behind your ear. Summary  Otitis externa is an infection of the outer ear canal.  Common causes include swimming in dirty water, moisture in the ear, or a cut or scrape in the ear.  Symptoms include pain, redness, and swelling of the ear.  If you were prescribed antibiotic ear drops, use them as told by your health care provider. Do not stop using the antibiotic even if your condition improves. This information is not intended to replace advice given to you by your health care provider. Make sure you discuss any questions you have with your health care provider. Document Released: 04/29/2005 Document Revised: 10/03/2017 Document Reviewed: 10/03/2017 Elsevier Interactive Patient Education  2019 [employer].

## 2018-10-23 NOTE — Progress Notes (Signed)
VIRTUAL VISIT Due to national recommendations of social distancing due to COVID 19, a virtual visit is felt to be most appropriate for this patient at this time.   I connected with the patient on 10/23/18 at  4:00 PM EDT by virtual telehealth platform and verified that I am speaking with the correct person using two identifiers.   I discussed the limitations, risks, security and privacy concerns of performing an evaluation and management service by  virtual telehealth platform and the availability of in person appointments. I also discussed with the patient that there may be a patient responsible charge related to this service. The patient expressed understanding and agreed to proceed.  Patient location: Home Provider [place] [facility] Participants: Kerby NoraAmy Darnel Mchan and Donney Rankinsandall D Lamoreaux   Chief Complaint  Patient presents with  . Ear Pain    Right x 3 days ?bump in ear    History of Present Illness: 43 year old male pt of Dr. Reece AgarG presents with new onset bump in right ear x 3 days.   He noted  soreness in right ear canal.  Wife noted swelling in ear canal.  Has been feeling some pressure and popping in ruight ear.  No discharge.  Using ibuprofen as needed.   Uses Q tips. No swimming.    no sinus pressure, no cough, no SOB, no fever.     COVID 19 screen No recent travel or known exposure to COVID19 The patient denies respiratory symptoms of COVID 19 at this time.  The importance of social distancing was discussed today.   Review of Systems  Constitutional: Negative for chills and fever.  HENT: Negative for congestion and ear pain.   Eyes: Negative for pain and redness.  Respiratory: Negative for cough and shortness of breath.   Cardiovascular: Negative for chest pain, palpitations and leg swelling.  Gastrointestinal: Negative for abdominal pain, blood in stool, constipation, diarrhea, nausea and vomiting.  Genitourinary: Negative for dysuria.  Musculoskeletal:  Negative for falls and myalgias.  Skin: Negative for rash.  Neurological: Negative for dizziness.  Psychiatric/Behavioral: Negative for depression. The patient is not nervous/anxious.       Past Medical History:  Diagnosis Date  . GERD (gastroesophageal reflux disease)    rare  . HLD (hyperlipidemia)   . NAFLD (nonalcoholic fatty liver disease) 16102003   s/p US and liver biopsy  . OSA on CPAP 02/2014   Dr Su MonksEjaz [facility]   . 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 Thyroid disorder    is being monitored for possible Hashimoto's; no current med    reports that he has never smoked. He has never used smokeless tobacco. He reports current alcohol use. He reports that he does not use drugs.   Current Outpatient Medications:  .  omeprazole (PRILOSEC) 40 MG capsule, Take one tablet daily for 3 weeks then as needed, Disp: 30 capsule, Rfl: 2   Observations/Objective: Height 6\' 1"  (1.854 m), weight 265 lb (120.2 kg).  Physical Exam  Physical Exam Constitutional:      General: The patient is not in acute distress. Pulmonary:     Effort: Pulmonary effort is normal. No respiratory distress.  Neurological:     Mental Status: The patient is alert and oriented to person, place, and time.  Psychiatric:        Mood and Affect: Mood normal.        Behavior: Behavior normal.   Unable to examine ear canal, but pinna normal and mils pain with  pulling on pinna and pressure on tragus  Assessment and Plan Acute otitis externa of right ear Most likely external ear infection vs external ear pimple/boil.  Treat with topical antibiotics and steroid. Ibuprofen prn.  If not improving as expected pt needs in person exam.     I discussed the assessment and treatment plan with the patient. The patient was provided an opportunity to ask questions and all were answered. The patient agreed with the plan and demonstrated an understanding of the instructions.   The patient was advised to call back or seek an  in-person evaluation if the symptoms worsen or if the condition fails to improve as anticipated.     Eliezer Lofts, MD

## 2019-02-11 ENCOUNTER — Ambulatory Visit: Payer: Managed Care, Other (non HMO)

## 2019-02-20 ENCOUNTER — Encounter: Payer: Self-pay | Admitting: Emergency Medicine

## 2019-02-20 ENCOUNTER — Emergency Department
Admission: EM | Admit: 2019-02-20 | Discharge: 2019-02-20 | Disposition: A | Discharge status: Home / Self Care | Payer: Managed Care, Other (non HMO) | Attending: Emergency Medicine | Admitting: Emergency Medicine

## 2019-02-20 ENCOUNTER — Other Ambulatory Visit: Payer: Self-pay

## 2019-02-20 DIAGNOSIS — R519 Headache, unspecified: Secondary | ICD-10-CM | POA: Insufficient documentation

## 2019-02-20 DIAGNOSIS — R002 Palpitations: Secondary | ICD-10-CM | POA: Insufficient documentation

## 2019-02-20 NOTE — ED Notes (Signed)
Signature pad no working: pt verbalizes understanding of DC instructions and  appointments. This RN answered all questions.

## 2019-02-20 NOTE — ED Provider Notes (Signed)
[facility] Emergency Department Provider Note  ____________________________________________  Time seen: Approximately 3:31 PM  I have reviewed the triage vital signs and the nursing notes.   HISTORY  Chief Complaint Headache    HPI Antonio Ferguson is a 43 y.o. male who presents the emergency department concern for headache, high blood pressure and a possible irregular heartbeat.  Patient states that he was at work, developed a headache.  Patient states that he decided to leave work early and go home.  He is in Event organiser, states that with recent events, coupled with recently moving building a new house, COVID-19, he has had increased stress.  Patient reports that he felt like headache was stress related, went home after taking Motrin.  When he laid down, patient's wife took his blood pressure and it was elevated at 160/108.  Patient reports that she listened to his heart and heard irregular heartbeat.  Patient has had a PVC identified on EKG in the past but is never had any other cardiac dysrhythmias.  No other heart problems.  Patient denied any palpitations or chest pain.  Patient reports that after taking Motrin his headache improved.  He never experienced any difficulty formulating words or thoughts.  No weakness or tingling on one side of the body or the other.  Other than Motrin, no medications prior to arrival.         Past Medical History:  Diagnosis Date  . GERD (gastroesophageal reflux disease)    rare  . HLD (hyperlipidemia)   . NAFLD (nonalcoholic fatty liver disease) 2003   s/p [place] and liver biopsy  . OSA on CPAP 02/2014   Dr Camillo Flaming [facility]   . 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 Acute otitis externa of right ear 10/23/2018  . Viral URI with cough 06/26/2018  . GERD (gastroesophageal reflux disease) 03/03/2018  . Inattention 03/03/2018  . Pain of right heel 07/09/2017  .  Lymphadenopathy of head and neck 07/09/2017  . Obesity, Class II, BMI 35-39.9, with comorbidity 07/17/2015  . Thyroid disorder   . Dyslipidemia   . Fatty liver     Past Surgical History:  Procedure Laterality Date  . COLONOSCOPY  2003   WNL  . LIVER BIOPSY  2003   fatty liver  . THYROGLOSSAL DUCT CYST N/A 12/20/2013   Procedure: EXCISION THYROGLOSSAL DUCT CYST;  Surgeon: Izora Gala, MD;  Location: [place];  Service: ENT;  Laterality: N/A;  . VASECTOMY  2009  . WISDOM TOOTH EXTRACTION      Prior to Admission medications   Medication Sig Start Date End Date Taking? Authorizing Provider  omeprazole (PRILOSEC) 40 MG capsule TAKE 1 CAPSULE BY MOUTH EVERY DAY FOR 3 WEEKS, THEN TAKE AS NEEDED 10/24/18   Ria Bush, MD    Allergies Oxycodone-acetaminophen and Latex  Family History  Problem Relation Age of Onset  . Healthy Mother   . Hypertension Father   . Hyperlipidemia Father   . Diabetes Father   . Peripheral Artery Disease Father        iliac stents (smoker)  . Thyroid disease Brother        HYPOTHYROID  . CAD Paternal Uncle        CABG  . Diabetes Paternal Grandfather   . Sudden Cardiac Death Maternal Uncle        and grandfather  . CAD Maternal Uncle  several uncles  . Cancer Paternal Grandmother        colon    Social History Social History   Tobacco Use  . Smoking status: Never Smoker  . Smokeless tobacco: Never Used  Substance Use Topics  . Alcohol use: Yes    Alcohol/week: 0.0 standard drinks    Comment: rare  . Drug use: No     Review of Systems  Constitutional: No fever/chills Eyes: No visual changes. No discharge ENT: No upper respiratory complaints. Cardiovascular: no chest pain.  Possible irregular heartbeat. Respiratory: no cough. No SOB. Gastrointestinal: No abdominal pain.  No nausea, no vomiting.  No diarrhea.  No constipation. Genitourinary: Negative for dysuria. No hematuria Musculoskeletal: Negative for  musculoskeletal pain. Skin: Negative for rash, abrasions, lacerations, ecchymosis. Neurological: Positive for headache, now resolved.  Denies focal weakness or numbness. 10-point ROS otherwise negative.  ____________________________________________   PHYSICAL EXAM:  VITAL SIGNS: ED Triage Vitals  Enc Vitals Group     BP 02/20/19 1504 (!) 159/89     Pulse Rate 02/20/19 1504 81     Resp 02/20/19 1504 18     Temp 02/20/19 1504 98.6 F (37 C)     Temp Source 02/20/19 1504 Oral     SpO2 02/20/19 1504 99 %     Weight 02/20/19 1505 272 lb (123.4 kg)     Height 02/20/19 1505 6\' 1"  (1.854 m)     Head Circumference --      Peak Flow --      Pain Score 02/20/19 1501 0     Pain Loc --      Pain Edu? --      Excl. in GC? --      Constitutional: Alert and oriented. Well appearing and in no acute distress. Eyes: Conjunctivae are normal. PERRL. EOMI. Head: Atraumatic. ENT:      Ears:       Nose: No congestion/rhinnorhea.      Mouth/Throat: Mucous membranes are moist.  Neck: No stridor.  Neck is supple full range of motion Hematological/Lymphatic/Immunilogical: No cervical lymphadenopathy. Cardiovascular: Normal rate, regular rhythm. Normal S1 and S2.  Good peripheral circulation. Respiratory: Normal respiratory effort without tachypnea or retractions. Lungs CTAB. Good air entry to the bases with no decreased or absent breath sounds. Musculoskeletal: Full range of motion to all extremities. No gross deformities appreciated. Neurologic:  Normal speech and language. No gross focal neurologic deficits are appreciated.  Cranial nerves II through XII grossly intact.  Negative Romberg's and pronator drift. Skin:  Skin is warm, dry and intact. No rash noted. Psychiatric: Mood and affect are normal. Speech and behavior are normal. Patient exhibits appropriate insight and judgement.   ____________________________________________   LABS (all labs ordered are listed, but only abnormal results  are displayed)  Labs Reviewed - No data to display ____________________________________________  EKG  ED ECG REPORT I, Delorise RoyalsJonathan D Cuthriell,  personally viewed and interpreted this ECG.   Date: 02/20/2019  EKG Time: 1502 hrs.  Rate: 86 bpm  Rhythm: unchanged from previous tracings, normal sinus rhythm, occasional PVC noted, unifocal, inverted T wave in lead III  Axis: Normal axis  Intervals:none  ST&T Change: No ST elevation or depression noted  Normal sinus rhythm.  PVC identified.  Inverted T wave in lead III consistent with previous EKGs.  No STEMI.  ____________________________________________  RADIOLOGY   No results found.  ____________________________________________    PROCEDURES  Procedure(s) performed:    Procedures    Medications - No  data to display   ____________________________________________   INITIAL IMPRESSION / ASSESSMENT AND PLAN / ED COURSE  Pertinent labs & imaging results that were available during my care of the patient were reviewed by me and considered in my medical decision making (see chart for details).  Review of the [place] CSRS was performed in accordance of the [facility] prior to dispensing any controlled drugs.           Patient's diagnosis is consistent with generalized headache.  Patient presented to the emergency department for evaluation of headache, elevated blood pressure, possible irregular heartbeat.  Patient reports that he developed a headache while at work.  He reports that he has been under a lot of stress, attributes the headache to stress.  He took Motrin which improved the headache.  Patient's wife is an Charity fundraiser, when he presented home she took his vitals and assessed that his blood pressure was 160/108 initially.  No history of hypertension.  Patient also had what appeared to be irregular heartbeat at home.  On presentation to the emergency department, patient's systolic blood pressure is still 159 over diastolic of 89.   Patient does not have any headache at this time.  He does report being slightly stressed and anxious having to come to the emergency department.  EKG revealed solitary PVC.  On review of medical records, patient had 2 previous EKGs on record, both showing solitary PVCs.  No identified arrhythmia.  No evidence of STEMI.  I discussed patient's symptoms at length with him and his wife.  Patient currently feels fine and states that he believes that everything is stress related.  I discussed potential work-up to include labs, CT scan of the head but both patient and his wife state that they just wanted to ensure that he was not in an arrhythmia.  At this time both of them feel reassured and are ready for discharge.  At this time my exam was reassuring, I agree that patient is stable at this time.  Return precautions are discussed at length.  Patient states that he has a primary care provider and will follow-up with him regarding this episode as well as ongoing stress.. Patient is given ED precautions to return to the ED for any worsening or new symptoms.     ____________________________________________  FINAL CLINICAL IMPRESSION(S) / ED DIAGNOSES  Final diagnoses:  Acute nonintractable headache, unspecified headache type      NEW MEDICATIONS STARTED DURING THIS VISIT:  ED Discharge Orders    None          This chart was dictated using voice recognition software/Dragon. Despite best efforts to proofread, errors can occur which can change the meaning. Any change was purely unintentional.    Racheal Patches, PA-C 02/20/19 1604    Dionne Bucy, MD 02/20/19 1919

## 2019-02-20 NOTE — ED Notes (Signed)
Refer to triage note: pt st while at work felt flushd, chills, severe headache, "shaky and anxious", took ibuprofen with some relief. Wife at bedside st "BP was high and irregular HR". Pt denies CP/SHOB/dizzines/vision changes. Pt A/Ox4. NAD upon assessment.

## 2019-02-20 NOTE — ED Triage Notes (Addendum)
Pt went home because of headache.  His wife is a Marine scientist and took his blood pressure and listened to heart and told him his heart beat was irregular and BP was up and should get checked.  Pt has had some added stress.  Denies pain at this time. Headache gone after motrin.  Pt denies any CP at any point, or feeling like heart skipping beat.  No vision changes. 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  No symptoms at this time.

## 2019-02-20 NOTE — ED Triage Notes (Signed)
Discussed with dr Archie Balboa, ok for flex

## 2019-02-23 ENCOUNTER — Encounter: Payer: Self-pay | Admitting: Family Medicine

## 2019-02-23 ENCOUNTER — Ambulatory Visit: Payer: Managed Care, Other (non HMO) | Admitting: Family Medicine

## 2019-02-23 ENCOUNTER — Other Ambulatory Visit: Payer: Self-pay

## 2019-02-23 VITALS — BP 138/88 | HR 78 | Temp 98.3°F | Ht 73.0 in | Wt 270.0 lb

## 2019-02-23 DIAGNOSIS — Z23 Encounter for immunization: Secondary | ICD-10-CM | POA: Diagnosis not present

## 2019-02-23 DIAGNOSIS — E079 Disorder of thyroid, unspecified: Secondary | ICD-10-CM

## 2019-02-23 DIAGNOSIS — E785 Hyperlipidemia, unspecified: Secondary | ICD-10-CM | POA: Diagnosis not present

## 2019-02-23 DIAGNOSIS — I10 Essential (primary) hypertension: Secondary | ICD-10-CM | POA: Insufficient documentation

## 2019-02-23 DIAGNOSIS — F419 Anxiety disorder, unspecified: Secondary | ICD-10-CM | POA: Diagnosis not present

## 2019-02-23 DIAGNOSIS — R030 Elevated blood-pressure reading, without diagnosis of hypertension: Secondary | ICD-10-CM

## 2019-02-23 LAB — LIPID PANEL
Cholesterol: 219 mg/dL — ABNORMAL HIGH (ref 0–200)
HDL: 39.8 mg/dL (ref >39.00)
NonHDL: 179.4
Total CHOL/HDL Ratio: 6
Triglycerides: 365 mg/dL — ABNORMAL HIGH (ref 0.0–149.0)
VLDL: 73 mg/dL — ABNORMAL HIGH (ref 0.0–40.0)

## 2019-02-23 LAB — COMPREHENSIVE METABOLIC PANEL
ALT: 134 U/L — ABNORMAL HIGH (ref 0–53)
AST: 59 U/L — ABNORMAL HIGH (ref 0–37)
Albumin: 4.8 g/dL (ref 3.5–5.2)
Alkaline Phosphatase: 108 U/L (ref 39–117)
BUN: 10 mg/dL (ref 6–23)
CO2: 31 mEq/L (ref 19–32)
Calcium: 10.1 mg/dL (ref 8.4–10.5)
Chloride: 101 mEq/L (ref 96–112)
Creatinine, Ser: 1.03 mg/dL (ref 0.40–1.50)
GFR: 79.6 mL/min (ref >60.00)
Glucose, Bld: 105 mg/dL — ABNORMAL HIGH (ref 70–99)
Potassium: 4.5 mEq/L (ref 3.5–5.1)
Sodium: 140 mEq/L (ref 135–145)
Total Bilirubin: 0.6 mg/dL (ref 0.2–1.2)
Total Protein: 7.3 g/dL (ref 6.0–8.3)

## 2019-02-23 LAB — T4, FREE: Free T4: 0.69 ng/dL (ref 0.60–1.60)

## 2019-02-23 LAB — LDL CHOLESTEROL, DIRECT: Direct LDL: 108 mg/dL

## 2019-02-23 LAB — TSH: TSH: 6.28 u[IU]/mL — ABNORMAL HIGH (ref 0.35–4.50)

## 2019-02-23 MED ORDER — HYDROXYZINE HCL 25 MG PO TABS: 12.5000 mg | ORAL_TABLET | Freq: Two times a day (BID) | ORAL | 0 refills | Status: DC | PRN

## 2019-02-23 NOTE — Progress Notes (Signed)
This visit was conducted in person.  BP 138/88 (BP Location: Right Arm, Cuff Size: Large)   Pulse 78   Temp 98.3 F (36.8 C)   Ht 6\' 1"  (1.854 m)   Wt 270 lb (122.5 kg) Comment: per patient  SpO2 96%   BMI 35.62 kg/m   BP Readings from Last 3 Encounters:  02/23/19 138/88  02/20/19 (!) 130/94  06/26/18 136/80    CC: ER f/u visit Subjective:    Patient ID: [id number], male    DOB: 09-Sep-1975, 43 y.o.   MRN: [id number]  HPI: Antonio Ferguson is a 43 y.o. male presenting on 02/23/2019 for ER follow up   Seen at ER over weekend with HA associated with possible elevated blood pressures and arrhythmia. Left work early 02/25/2019). 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 BP at home elevated up to 160/108. Treated HA at home with motrin with benefit. ER records reviewed. BP remained elevated at hospital. EKG stable (occasional PVC).   During episode he felt flushed, throbbing HA, but no diaphoresis.  Denies chest pain, tightness, dizziness or presyncope. He feels symptoms are more stress related.  Increase in anxiety/stress over the past year.  He has worked Systems analyst in the past.  H/o panic attack x1 several years ago.  Finds he can suddenly relive life-threatening work situations. Can also have nightmares from these situations.  Works rotating shifts, poor sleep schedule because he wants to be present for children's events. 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  Feels burnt out from job. Hopes to retire in 4.5 yrs.   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  H/o dyslipidemia (predominantly triglycerides). He had some creamer this morning in his coffee.      Relevant past medical, surgical, family and social history reviewed and updated as indicated. Interim medical history since our last visit reviewed. Allergies and  medications reviewed and updated. Outpatient Medications Prior to Visit  Medication Sig Dispense Refill  . omeprazole (PRILOSEC) 40 MG capsule TAKE 1 CAPSULE BY MOUTH EVERY DAY FOR 3 WEEKS, THEN TAKE AS NEEDED 30 capsule 2   No facility-administered medications prior to visit.      Per HPI unless specifically indicated in ROS section below Review of Systems Objective:    BP 138/88 (BP Location: Right Arm, Cuff Size: Large)   Pulse 78   Temp 98.3 F (36.8 C)   Ht 6\' 1"  (1.854 m)   Wt 270 lb (122.5 kg) Comment: per patient  SpO2 96%   BMI 35.62 kg/m   Wt Readings from Last 3 Encounters:  02/23/19 270 lb (122.5 kg)  02/20/19 272 lb (123.4 kg)  10/23/18 265 lb (120.2 kg)    Physical Exam Vitals signs and nursing note reviewed.  Constitutional:      General: He is not in acute distress.    Appearance: Normal appearance. He is not ill-appearing.  HENT:     Mouth/Throat:     Mouth: Mucous membranes are moist.     Pharynx: Oropharynx is clear. No posterior oropharyngeal erythema.  Eyes:     Extraocular Movements: Extraocular movements intact.     Pupils: Pupils are equal, round, and reactive to light.  Neck:     Thyroid: No thyroid mass, thyromegaly or thyroid tenderness.  Cardiovascular:     Rate and Rhythm: Normal rate and regular  rhythm.     Pulses: Normal pulses.     Heart sounds: Normal heart sounds. No murmur.  Pulmonary:     Effort: Pulmonary effort is normal. No respiratory distress.     Breath sounds: Normal breath sounds. No wheezing, rhonchi or rales.  Skin:    Findings: No rash.  Neurological:     Mental Status: He is alert.  Psychiatric:        Mood and Affect: Mood normal.        Behavior: Behavior normal.       Lab Results  Component Value Date   TSH 2.41 07/21/2017    Lab Results  Component Value Date   CHOL 196 07/21/2017   HDL 34.10 (L) 07/21/2017   LDLCALC 94 08/28/2015   LDLDIRECT 68.0 07/21/2017   TRIG (H) 07/21/2017    427.0  Triglyceride is over 400; calculations on Lipids are invalid.   CHOLHDL 6 07/21/2017   Depression screen PHQ 2/9 02/23/2019  Decreased Interest 1  Down, Depressed, Hopeless 1  PHQ - 2 Score 2  Altered sleeping 2  Tired, decreased energy 3  Change in appetite 1  Feeling bad or failure about yourself  1  Trouble concentrating 3  Moving slowly or fidgety/restless 0  Suicidal thoughts 0  PHQ-9 Score 12     GAD 7 : Generalized Anxiety Score 02/23/2019  Nervous, Anxious, on Edge 3  Control/stop worrying 3  Worry too much - different things 3  Trouble relaxing 3  Restless 2  Easily annoyed or irritable 3  Afraid - awful might happen 3  Total GAD 7 Score 20   Assessment & Plan:   Problem List Items Addressed This Visit    Thyroid disorder    Possible h/o hashimoto's saw endo previously. Update TFTs.      Relevant Orders   TSH   T4, free   Elevated blood pressure reading without diagnosis of hypertension    BP elevated over weekend, now returning to normal. I encouraged he monitor blood pressures more closely at home, reviewed optimal way to check BP, to let us know if BP consistently >140/90.       Dyslipidemia    Update FLP      Relevant Orders   Lipid panel   Comprehensive metabolic panel   Anxiety - Primary    Anticipate recent ER symptoms likely due to stress related to anxiety. He had reassuring EKG which was reviewed (V3 changes likely due to lead placement). Discussed with patient as well as treatment options. He declines daily medication or counseling at this time but will let me know if he changes his mind. Will start with Rx hydroxyzine PRN anxiety/stressors. Reassess when he returns for CPE. Pt agrees with plan.       Relevant Medications   hydrOXYzine (ATARAX/VISTARIL) 25 MG tablet       Meds ordered this encounter  Medications  . hydrOXYzine (ATARAX/VISTARIL) 25 MG tablet    Sig: Take 0.5-1 tablets (12.5-25 mg total) by mouth 2 (two) times daily as  needed for anxiety.    Dispense:  30 tablet    Refill:  0   Orders Placed This Encounter  Procedures  . Flu Vaccine QUAD 6+ mos PF IM (Fluarix Quad PF)  . Lipid panel  . Comprehensive metabolic panel  . TSH  . T4, free    Patient Instructions  Flu shot today Labs today (thyroid check).  Start monitoring blood pressures more closely at home, let me  know if persistently >140/90.  Trial hydroxyzine 25mg  1/2-1 tablet twice daily as needed for stress/anxiety, watch sedation.  Consider daily medication for mood, consider referral to counseling, let me know if you decide to try either.  Return as needed or at your convenience for physical.    Follow up plan: Return in about 2 months (around 04/25/2019) for annual exam, prior fasting for blood work.  Eustaquio BoydenJavier Poppy Mcafee, MD

## 2019-02-23 NOTE — Assessment & Plan Note (Signed)
BP elevated over weekend, now returning to normal. I encouraged he monitor blood pressures more closely at home, reviewed optimal way to check BP, to let us know if BP consistently >140/90.

## 2019-02-23 NOTE — Patient Instructions (Addendum)
Flu shot today Labs today (thyroid check).  Start monitoring blood pressures more closely at home, let me know if persistently >140/90.  Trial hydroxyzine 25mg  1/2-1 tablet twice daily as needed for stress/anxiety, watch sedation.  Consider daily medication for mood, consider referral to counseling, let me know if you decide to try either.  Return as needed or at your convenience for physical.

## 2019-02-23 NOTE — Assessment & Plan Note (Signed)
Possible h/o hashimoto's saw endo previously. Update TFTs.

## 2019-02-23 NOTE — Assessment & Plan Note (Signed)
Update FLP.  

## 2019-02-23 NOTE — Assessment & Plan Note (Addendum)
Anticipate recent ER symptoms likely due to stress related to anxiety. He had reassuring EKG which was reviewed (V3 changes likely due to lead placement). Discussed with patient as well as treatment options. He declines daily medication or counseling at this time but will let me know if he changes his mind. Will start with Rx hydroxyzine PRN anxiety/stressors. Reassess when he returns for CPE. Pt agrees with plan.

## 2019-02-24 ENCOUNTER — Other Ambulatory Visit: Payer: Self-pay | Admitting: Family Medicine

## 2019-02-24 ENCOUNTER — Encounter: Payer: Self-pay | Admitting: Family Medicine

## 2019-02-24 DIAGNOSIS — K760 Fatty (change of) liver, not elsewhere classified: Secondary | ICD-10-CM

## 2019-02-26 ENCOUNTER — Encounter: Payer: Self-pay | Admitting: Family Medicine

## 2019-02-26 MED ORDER — LEVOTHYROXINE SODIUM 50 MCG PO TABS: 50.0000 ug | ORAL_TABLET | Freq: Every day | ORAL | 6 refills | Status: DC

## 2019-02-26 NOTE — Telephone Encounter (Signed)
Already replied

## 2019-03-04 ENCOUNTER — Other Ambulatory Visit: Payer: Self-pay | Admitting: Family Medicine

## 2019-03-16 ENCOUNTER — Encounter: Payer: Self-pay | Admitting: Family Medicine

## 2019-04-05 ENCOUNTER — Encounter: Payer: Self-pay | Admitting: Family Medicine

## 2019-04-05 DIAGNOSIS — E039 Hypothyroidism, unspecified: Secondary | ICD-10-CM

## 2019-04-05 DIAGNOSIS — E785 Hyperlipidemia, unspecified: Secondary | ICD-10-CM

## 2019-04-05 DIAGNOSIS — K760 Fatty (change of) liver, not elsewhere classified: Secondary | ICD-10-CM

## 2019-04-05 DIAGNOSIS — E038 Other specified hypothyroidism: Secondary | ICD-10-CM

## 2019-04-05 NOTE — Telephone Encounter (Signed)
Left message on vm per dpr asking pt to call back.  Needs to schedule lab visit.

## 2019-04-07 MED ORDER — AMLODIPINE BESYLATE 5 MG PO TABS: 5.0000 mg | ORAL_TABLET | Freq: Every day | ORAL | 6 refills | Status: DC

## 2019-04-07 NOTE — Addendum Note (Signed)
Addended by: Ria Bush on: 04/07/2019 03:26 PM   Modules accepted: Orders

## 2019-04-07 NOTE — Telephone Encounter (Signed)
Start amlodipine 5mg  daily for elevated BP.

## 2019-04-12 ENCOUNTER — Other Ambulatory Visit (INDEPENDENT_AMBULATORY_CARE_PROVIDER_SITE_OTHER): Payer: Managed Care, Other (non HMO)

## 2019-04-12 ENCOUNTER — Other Ambulatory Visit: Payer: Self-pay

## 2019-04-12 DIAGNOSIS — E785 Hyperlipidemia, unspecified: Secondary | ICD-10-CM

## 2019-04-12 DIAGNOSIS — E039 Hypothyroidism, unspecified: Secondary | ICD-10-CM

## 2019-04-12 DIAGNOSIS — K760 Fatty (change of) liver, not elsewhere classified: Secondary | ICD-10-CM

## 2019-04-12 DIAGNOSIS — E038 Other specified hypothyroidism: Secondary | ICD-10-CM

## 2019-04-12 LAB — HEPATIC FUNCTION PANEL
ALT: 128 U/L — ABNORMAL HIGH (ref 0–53)
AST: 49 U/L — ABNORMAL HIGH (ref 0–37)
Albumin: 4.1 g/dL (ref 3.5–5.2)
Alkaline Phosphatase: 96 U/L (ref 39–117)
Bilirubin, Direct: 0.1 mg/dL (ref 0.0–0.3)
Total Bilirubin: 0.7 mg/dL (ref 0.2–1.2)
Total Protein: 6.6 g/dL (ref 6.0–8.3)

## 2019-04-12 LAB — LIPID PANEL
Cholesterol: 215 mg/dL — ABNORMAL HIGH (ref 0–200)
HDL: 36.1 mg/dL — ABNORMAL LOW (ref >39.00)
NonHDL: 178.74
Total CHOL/HDL Ratio: 6
Triglycerides: 342 mg/dL — ABNORMAL HIGH (ref 0.0–149.0)
VLDL: 68.4 mg/dL — ABNORMAL HIGH (ref 0.0–40.0)

## 2019-04-12 LAB — T4, FREE: Free T4: 0.77 ng/dL (ref 0.60–1.60)

## 2019-04-12 LAB — LDL CHOLESTEROL, DIRECT: Direct LDL: 102 mg/dL

## 2019-04-12 LAB — FERRITIN: Ferritin: 431.8 ng/mL — ABNORMAL HIGH (ref 22.0–322.0)

## 2019-04-12 LAB — TSH: TSH: 2.27 u[IU]/mL (ref 0.35–4.50)

## 2019-04-13 ENCOUNTER — Encounter: Payer: Self-pay | Admitting: Family Medicine

## 2019-04-13 ENCOUNTER — Other Ambulatory Visit: Payer: Self-pay | Admitting: Family Medicine

## 2019-04-13 DIAGNOSIS — R7989 Other specified abnormal findings of blood chemistry: Secondary | ICD-10-CM | POA: Insufficient documentation

## 2019-04-13 DIAGNOSIS — R7401 Elevation of levels of liver transaminase levels: Secondary | ICD-10-CM | POA: Insufficient documentation

## 2019-04-13 NOTE — Telephone Encounter (Signed)
Pt left v/m requesting cb from Dr Darnell Level to go over the lab results pt got today; pt has more questions.

## 2019-04-14 ENCOUNTER — Other Ambulatory Visit (INDEPENDENT_AMBULATORY_CARE_PROVIDER_SITE_OTHER): Payer: Managed Care, Other (non HMO)

## 2019-04-14 ENCOUNTER — Other Ambulatory Visit: Payer: Self-pay

## 2019-04-14 LAB — CBC WITH DIFFERENTIAL/PLATELET
Basophils Absolute: 0 10*3/uL (ref 0.0–0.1)
Basophils Relative: 0.6 % (ref 0.0–3.0)
Eosinophils Absolute: 0.1 10*3/uL (ref 0.0–0.7)
Eosinophils Relative: 2.5 % (ref 0.0–5.0)
HCT: 44.5 % (ref 39.0–52.0)
Hemoglobin: 15.3 g/dL (ref 13.0–17.0)
Lymphocytes Relative: 29.8 % (ref 12.0–46.0)
Lymphs Abs: 1.7 10*3/uL (ref 0.7–4.0)
MCHC: 34.5 g/dL (ref 30.0–36.0)
MCV: 88.9 fl (ref 78.0–100.0)
Monocytes Absolute: 0.6 10*3/uL (ref 0.1–1.0)
Monocytes Relative: 9.8 % (ref 3.0–12.0)
Neutro Abs: 3.3 10*3/uL (ref 1.4–7.7)
Neutrophils Relative %: 57.3 % (ref 43.0–77.0)
Platelets: 198 10*3/uL (ref 150.0–400.0)
RBC: 5 Mil/uL (ref 4.22–5.81)
RDW: 12.6 % (ref 11.5–15.5)
WBC: 5.7 10*3/uL (ref 4.0–10.5)

## 2019-04-14 LAB — IBC PANEL
Iron: 99 ug/dL (ref 42–165)
Saturation Ratios: 25.4 % (ref 20.0–50.0)
Transferrin: 278 mg/dL (ref 212.0–360.0)

## 2019-04-14 NOTE — Telephone Encounter (Signed)
Spoke with patient when he came in for labs today.

## 2019-04-23 LAB — HEMOCHROMATOSIS DNA-PCR(C282Y,H63D)

## 2019-04-24 ENCOUNTER — Encounter: Payer: Self-pay | Admitting: Family Medicine

## 2019-05-26 ENCOUNTER — Encounter: Payer: Self-pay | Admitting: Family Medicine

## 2019-06-03 ENCOUNTER — Encounter: Payer: Self-pay | Admitting: Family Medicine

## 2019-06-03 NOTE — Telephone Encounter (Signed)
Plz call to schedule OV with next available provider. Thanks.

## 2019-06-03 NOTE — Telephone Encounter (Signed)
[facility] Night - Client TELEPHONE ADVICE RECORD [facility] Patient Name: Antonio Ferguson Gender: Male DOB: 05/25/1975 Age: 44 Y 2 M 20 D Return Phone Number: [phone] (Primary), [phone] (Secondary) Address[place] [address] Client [facility] Night - Client Client Site [facility] - Night Physician Eustaquio Boyden - MD Contact Type Call Who Is Calling Patient / Member / Family / Caregiver Call Type Triage / Clinical Caller Name Shaw Dobek Relationship To Patient Spouse Return Phone Number [phone] (Primary) Chief Complaint Headache Reason for Call Symptomatic / Request for Health Information Initial Comment Caller states she would like to speak to someone regarding her husband. Caller states that he is experiencing a bad headache on the right side and his blood pressure is 178/120 Translation No Nurse Assessment Nurse: Alexander Mt, RN, Nicholaus Bloom Date/Time (Eastern Time): 06/02/2019 5:25:56 PM Confirm and document reason for call. If symptomatic, describe symptoms. ---Caller states that he is experiencing a bad headache on the right side that started about 30 min ago and his blood pressure is 178/120 10 min ago. Has the patient had close contact with a person known or suspected to have the novel coronavirus illness OR traveled / lives in area with major community spread (including international travel) in the last 14 days from the onset of symptoms? * If Asymptomatic, screen for exposure and travel within the last 14 days. ---No Does the patient have any new or worsening symptoms? ---Yes Will a triage be completed? ---Yes Related visit to physician within the last 2 weeks? ---No Does the PT have any chronic conditions? (i.e. diabetes, asthma, this includes High risk factors for pregnancy, etc.) ---Yes List chronic conditions. ---HTN, thyroid. Is this a behavioral health or  substance abuse call? ---No Guidelines Guideline Title Affirmed Question Affirmed Notes Nurse Date/Time (Eastern Time) Headache [1] Headache AND [2] has not taken pain medications Alexander Mt, RN, Nicholaus Bloom 06/02/2019 5:27:45 PM PLEASE NOTE: All timestamps contained within this report are represented as [place] Standard Time. CONFIDENTIALTY NOTICE: This fax transmission is intended only for the addressee. It contains information that is legally privileged, confidential or otherwise protected from use or disclosure. If you are not the intended recipient, you are strictly prohibited from reviewing, disclosing, copying using or disseminating any of this information or taking any action in reliance on or regarding this information. If you have received this fax in error, please notify us immediately by telephone so that we can arrange for its return to [place]. Phone: [phone], Toll-Free: [phone], Fax: [phone] Page: 2 of 2 Call Id: [id number] Guidelines Guideline Title Affirmed Question Affirmed Notes Nurse Date/Time Lamount Cohen Time) High Blood Pressure [1] Systolic BP >= 160 OR Diastolic >= 100 AND [2] cardiac or neurologic symptoms (e.g., chest pain, difficulty breathing, unsteady gait, blurred vision) Alexander Mt, RN, Nicholaus Bloom 06/02/2019 5:30:39 PM Disp. Time Lamount Cohen Time) Disposition Final User 06/02/2019 5:30:25 PM [facility] [place], RN, Nicholaus Bloom 06/02/2019 5:32:11 PM Go to ED Now Yes Alexander Mt, RN, Prentiss Bells Disagree/Comply Disagree Caller Understands Yes PreDisposition Home Care Care Advice Given Per Guideline HOME CARE: CARE ADVICE given per Headache (Adult) guideline. GO TO ED NOW: * You need to be seen in the Emergency Department. * Go to the ED at ___________ Hospital. * Leave now. Drive carefully. CARE ADVICE given per High Blood Pressure (Adult) guideline. Referrals [facility] - ED

## 2019-06-03 NOTE — Telephone Encounter (Signed)
Patient called back to schedule virtual. Patient wife checks the BP and he stated she would not be in until after 4:30 Advised patient we do not have appts that late. He said he would schedule for tomorrow afternoon with a provider.  Dr Milinda Antis @ 3:45   Advised patient to monitor BP if it continue to be elevated or symptoms worsen to go to an UC to be evaluated . Patient stated he understood and will monitor

## 2019-06-03 NOTE — Telephone Encounter (Signed)
I spoke with pt; pt did not go to ED or UC. Pt took ibuprofen ad BP went down to 140/80. Pt had diarrhea 3 days ago and H/A on rt side of head behind eye. Pt has no other covid symptoms. Pt does not want to go to UC and request virtual appt at [facility]. Pt cannot take BP until wife gets home. Pt messaged wife but no response while on phone. Pt is not sure what time he can do virtual until speaks with his wife. Pt will cb for appt. UC & ED precautions given and pt voiced understanding.Dr Reece Agar out of office. Pt said would be later in afternoon when would be able to do virtual. Will send to Pamala Hurry NP who has appts this afternoon.

## 2019-06-04 ENCOUNTER — Ambulatory Visit (INDEPENDENT_AMBULATORY_CARE_PROVIDER_SITE_OTHER): Payer: Managed Care, Other (non HMO) | Admitting: Family Medicine

## 2019-06-04 ENCOUNTER — Encounter: Payer: Self-pay | Admitting: Family Medicine

## 2019-06-04 ENCOUNTER — Other Ambulatory Visit: Payer: Self-pay

## 2019-06-04 VITALS — BP 140/88 | HR 80 | Temp 97.9°F

## 2019-06-04 DIAGNOSIS — F430 Acute stress reaction: Secondary | ICD-10-CM

## 2019-06-04 DIAGNOSIS — I10 Essential (primary) hypertension: Secondary | ICD-10-CM

## 2019-06-04 MED ORDER — METOPROLOL SUCCINATE ER 50 MG PO TB24: 50.0000 mg | ORAL_TABLET | Freq: Every day | ORAL | 3 refills | Status: DC

## 2019-06-04 NOTE — Progress Notes (Signed)
Virtual Visit via Video Note  I connected with Antonio Ferguson on 06/04/19 at  3:45 PM EST by a video enabled telemedicine application and verified that I am speaking with the correct person using two identifiers.  Location: Patient: home Provider: office    I discussed the limitations of evaluation and management by telemedicine and the availability of in person appointments. The patient expressed understanding and agreed to proceed.  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  Takes meds when he comes home in the am   No etoh  Has h/o fatty liver  Is eating better and working out more  Caffeine- 2 cups of coffee and one diet soda per day   Stress has been problematic - more over the past year  Especially with his job  Some family issues Given hydroxyzine - and it does not help a lot (for anxiety), it does help sleep   Family hx of HTN  Brothers and father   BP Readings from Last 3 Encounters:  06/04/19 140/88  02/23/19 138/88  02/20/19 (!) 130/94   Pulse Readings from Last 3 Encounters:  06/04/19 80  02/23/19 78  02/20/19 72     Lab Results  Component Value Date   CREATININE 1.03 02/23/2019   BUN 10 02/23/2019   NA 140 02/23/2019   K 4.5 02/23/2019   CL 101 02/23/2019   CO2 31 02/23/2019   Lab Results  Component Value Date   ALT 128 (H) 04/12/2019   AST 49 (H) 04/12/2019   GGT 32 12/05/2014   ALKPHOS 96 04/12/2019   BILITOT 0.7 04/12/2019   pt has a h/o fatty liver   Lab Results  Component  Value Date   WBC 5.7 04/14/2019   HGB 15.3 04/14/2019   HCT 44.5 04/14/2019   MCV 88.9 04/14/2019   PLT 198.0 04/14/2019   Lab Results  Component Value Date   TSH 2.27 04/12/2019    Lab Results  Component Value Date   CHOL 215 (H) 04/12/2019   HDL 36.10 (L) 04/12/2019   LDLCALC 94 08/28/2015   LDLDIRECT 102.0 04/12/2019   TRIG 342.0 (H) 04/12/2019   CHOLHDL 6 04/12/2019  h/o high triglycerides     Patient Active Problem List   Diagnosis Date Noted  . Stress reaction 06/06/2019  . Elevated ferritin 04/13/2019  . Transaminitis 04/13/2019  . Anxiety 02/23/2019  . Essential hypertension 02/23/2019  . GERD (gastroesophageal reflux disease) 03/03/2018  . Inattention 03/03/2018  . Pain of right heel 07/09/2017  . Obesity, Class II, BMI 35-39.9, with comorbidity 07/17/2015  . Subclinical hypothyroidism   . Dyslipidemia   . Fatty liver    Past Medical History:  Diagnosis Date  . GERD (gastroesophageal reflux disease)    rare  . HLD (hyperlipidemia)   . NAFLD (nonalcoholic fatty liver disease) 2585   s/p [place] and liver biopsy  . OSA on CPAP 02/2014  Dr Su Monks [facility]   . 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 COLONOSCOPY  2003   WNL  . LIVER BIOPSY  2003   fatty liver  . THYROGLOSSAL DUCT CYST N/A 12/20/2013   Procedure: EXCISION THYROGLOSSAL DUCT CYST;  Surgeon: Serena Colonel, MD;  Location: [facility];  Service: ENT;  Laterality: N/A;  . VASECTOMY  2009  . WISDOM TOOTH EXTRACTION     Social History   Tobacco Use  . Smoking status: Never Smoker  . Smokeless tobacco: Never Used  Substance Use Topics  . Alcohol use: Yes    Alcohol/week: 0.0 standard drinks    Comment: rare  . Drug use: No   Family History  Problem Relation Age of Onset  . Healthy Mother   . Hypertension Father   . Hyperlipidemia Father   . Diabetes Father   . Peripheral Artery Disease Father        iliac stents (smoker)   . Diverticulitis Father        needed surgery  . Hypothyroidism Brother   . CAD Paternal Uncle        CABG  . Diabetes Paternal Grandfather   . Sudden Cardiac Death Maternal Uncle        and grandfather  . CAD Maternal Uncle        several uncles  . Cancer Paternal Grandmother        colon   Allergies  Allergen Reactions  . Oxycodone-Acetaminophen Nausea Only  . Latex Other (See Comments)    BURNING OF FACE IF LATEX TOUCHES FACE   Current Outpatient Medications on File Prior to Visit  Medication Sig Dispense Refill  . amLODipine (NORVASC) 5 MG tablet Take 1 tablet (5 mg total) by mouth daily. 30 tablet 6  . hydrOXYzine (ATARAX/VISTARIL) 25 MG tablet TAKE 0.5-1 TABLETS (12.5-25 MG TOTAL) BY MOUTH 2 (TWO) TIMES DAILY AS NEEDED FOR ANXIETY. 180 tablet 0  . levothyroxine (SYNTHROID) 50 MCG tablet Take 1 tablet (50 mcg total) by mouth daily. 30 tablet 6  . omeprazole (PRILOSEC) 40 MG capsule TAKE 1 CAPSULE BY MOUTH EVERY DAY FOR 3 WEEKS, THEN TAKE AS NEEDED 30 capsule 2   No current facility-administered medications on file prior to visit.   Review of Systems  Constitutional: Negative for chills, fever and malaise/fatigue.  HENT: Negative for congestion, ear pain, sinus pain and sore throat.   Eyes: Negative for blurred vision, discharge and redness.  Respiratory: Negative for cough, shortness of breath and stridor.   Cardiovascular: Positive for palpitations. Negative for chest pain and leg swelling.       Heart rate goes up at times  Gastrointestinal: Negative for abdominal pain, diarrhea, nausea and vomiting.  Musculoskeletal: Negative for myalgias.  Skin: Negative for rash.  Neurological: Positive for headaches. Negative for dizziness.       HA with elevated BP  Psychiatric/Behavioral: The patient is nervous/anxious.     Observations/Objective: Patient appears well, in no distress Weight is baseline  No facial swelling or asymmetry Normal voice-not hoarse and no slurred  speech No obvious tremor or mobility impairment Moving neck and UEs normally Able to hear the call well  No cough or shortness of breath during interview  Talkative and mentally sharp with no cognitive changes, good historian No skin changes on face or neck , no rash or pallor Affect is normal (does not seem anxious today)    Assessment and Plan:  Problem List Items Addressed This Visit      Cardiovascular and Mediastinum   Essential hypertension - Primary    Not optimally controlled with amlodipine 5 mg  Having some anxiety /racing heart and headaches as well  Disc trial of beta blocker (discussed in detail)  Px metoprolol xl 50 mg to take one daily  Watch for dizziness/hypotension or bradycardia inst pt to f/u with Dr Darnell Level in the next 2 wk or so  Also keep up the good new health habits       Relevant Medications   metoprolol succinate (TOPROL-XL) 50 MG 24 hr tablet     Other   Stress reaction    This may add to his elevated BP  Working on self care and lifestyle change  Counseling may help in the future           Follow Up Instructions: Let's try metoprolol for your blood pressure  It will also lower heart rate -which may be helpful, and also help with headaches and also feelings of anxiety (mildly) Continue to practice good self care  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 The patient was provided an opportunity to ask questions and all were answered. The patient agreed with the plan and demonstrated an understanding of the instructions.   The patient was advised to call back or seek an in-person evaluation if the symptoms worsen or if the condition fails to improve as anticipated.     Loura Pardon, MD

## 2019-06-04 NOTE — Assessment & Plan Note (Signed)
Not optimally controlled with amlodipine 5 mg  Having some anxiety /racing heart and headaches as well  Disc trial of beta blocker (discussed in detail)  Px metoprolol xl 50 mg to take one daily  Watch for dizziness/hypotension or bradycardia inst pt to f/u with Dr Reece Agar in the next 2 wk or so  Also keep up the good new health habits

## 2019-06-06 DIAGNOSIS — F430 Acute stress reaction: Secondary | ICD-10-CM | POA: Insufficient documentation

## 2019-06-06 NOTE — Patient Instructions (Signed)
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

## 2019-06-06 NOTE — Assessment & Plan Note (Signed)
This may add to his elevated BP  Working on self care and lifestyle change  Counseling may help in the future

## 2019-06-16 ENCOUNTER — Other Ambulatory Visit: Payer: Self-pay | Admitting: Family Medicine

## 2019-06-17 ENCOUNTER — Encounter: Payer: Self-pay | Admitting: Family Medicine

## 2019-06-25 ENCOUNTER — Encounter: Payer: Self-pay | Admitting: Family Medicine

## 2019-06-25 ENCOUNTER — Ambulatory Visit: Payer: Managed Care, Other (non HMO) | Admitting: Family Medicine

## 2019-06-25 ENCOUNTER — Other Ambulatory Visit: Payer: Self-pay

## 2019-06-25 VITALS — BP 134/76 | HR 85 | Temp 97.9°F | Ht 73.0 in | Wt 288.0 lb

## 2019-06-25 DIAGNOSIS — F419 Anxiety disorder, unspecified: Secondary | ICD-10-CM

## 2019-06-25 DIAGNOSIS — I10 Essential (primary) hypertension: Secondary | ICD-10-CM

## 2019-06-25 DIAGNOSIS — E039 Hypothyroidism, unspecified: Secondary | ICD-10-CM

## 2019-06-25 DIAGNOSIS — K219 Gastro-esophageal reflux disease without esophagitis: Secondary | ICD-10-CM

## 2019-06-25 DIAGNOSIS — E038 Other specified hypothyroidism: Secondary | ICD-10-CM

## 2019-06-25 MED ORDER — METOPROLOL SUCCINATE ER 25 MG PO TB24: 25.0000 mg | ORAL_TABLET | Freq: Every day | ORAL | 1 refills | Status: DC

## 2019-06-25 NOTE — Assessment & Plan Note (Signed)
Reviewed recent weight gain noted. He is motivated to implement healthy lifestyle changes to affect sustainable weight loss.

## 2019-06-25 NOTE — Assessment & Plan Note (Addendum)
Stable period on levothyroxine daily - beta blocker initially worsened fatigue, tolerating 1/2 dose better. Reviewed correct administration of levothyroxine in relation to MVI (4 hours) and PPI (at least 2 hours).

## 2019-06-25 NOTE — Assessment & Plan Note (Addendum)
Overall improvement in anxiety levels without recent anxiety attack but work related stress persists. Metoprolol XL has helped with this. Hydroxyzine not very effective. Not currently interested in daily anxiety medication.  Has counseling available through work if desired.

## 2019-06-25 NOTE — Assessment & Plan Note (Signed)
Managed with daily PPI  

## 2019-06-25 NOTE — Progress Notes (Signed)
This visit was conducted in person.  BP 134/76 (BP Location: Left Arm, Patient Position: Sitting, Cuff Size: Large)   Pulse 85   Temp 97.9 F (36.6 C) (Temporal)   Ht 6\' 1"  (1.854 m)   Wt 288 lb (130.6 kg)   SpO2 97%   BMI 38.00 kg/m    CC: HTN f/u visit Subjective:    Patient ID: , male    DOB: [birth year], 44 y.o.   MRN: [id number]  HPI: Antonio Ferguson is a 44 y.o. male presenting on 06/25/2019 for Hypertension (Here for f/u from recent visit with Dr. 08/23/2019.  Stopped amlodipine, takes 1/2 tab metoprolol. )   Recently saw Dr Selena Batten virtually, note reviewed.   HTN - Compliant with current antihypertensive regimen of toprol XL 50mg  1/2 tab daily. Does check blood pressures at home: 130/70s.  No low blood pressure readings or symptoms of dizziness/syncope.  Denies HA, vision changes, CP/tightness, SOB, leg swelling.   Prior to starting toprol XL - BP was very high, leading to headaches. He feels a lot of this was due to stress/anxiety.   Anxiety - notes heart fluttering feeling when he can get stressed. Hydroxyzine doesn't really help anxiety. 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   Has minimized alcohol intake - only 2 beers in last 3 months.  Weight gain noted - he just bought peloton bike for wife. Motivated to work on weight loss.   Bump on nose present for the past year. Previously bled but not recently.      Relevant past medical, surgical, family and social history reviewed and updated as indicated. Interim medical history since our last visit reviewed. Allergies and medications reviewed and updated. Outpatient Medications Prior to Visit  Medication Sig Dispense Refill  . hydrOXYzine (ATARAX/VISTARIL) 25 MG tablet TAKE 0.5-1 TABLETS (12.5-25 MG TOTAL) BY MOUTH 2 (TWO) TIMES DAILY AS NEEDED FOR ANXIETY. 180 tablet 0  . levothyroxine (SYNTHROID) 50 MCG tablet Take 1 tablet (50 mcg total) by mouth daily. 30 tablet 6  . omeprazole (PRILOSEC) 40 MG capsule  Take 1 capsule (40 mg total) by mouth daily.    . metoprolol succinate (TOPROL-XL) 50 MG 24 hr tablet Take 1 tablet (50 mg total) by mouth daily. Take with or immediately following a meal. (Patient taking differently: Take 50 mg by mouth daily. Take with or immediately following a meal.  Takes 1/2 tablet) 30 tablet 3  . omeprazole (PRILOSEC) 40 MG capsule Please call to schedule appointment for physical TAKE 1 CAPSULE BY MOUTH EVERY DAY FOR 3 WEEKS, THEN TAKE AS NEEDED 90 capsule 0  . amLODipine (NORVASC) 5 MG tablet Take 1 tablet (5 mg total) by mouth daily. (Patient not taking: Reported on 06/25/2019) 30 tablet 6   No facility-administered medications prior to visit.     Per HPI unless specifically indicated in ROS section below Review of Systems Objective:    BP 134/76 (BP Location: Left Arm, Patient Position: Sitting, Cuff Size: Large)   Pulse 85   Temp 97.9 F (36.6 C) (Temporal)   Ht 6\' 1"  (1.854 m)   Wt 288 lb (130.6 kg)   SpO2 97%   BMI 38.00 kg/m   Wt Readings from Last 3 Encounters:  06/25/19 288 lb (130.6 kg)  02/23/19 270 lb (122.5 kg)  02/20/19 272 lb (123.4 kg)    Physical Exam Vitals and nursing note reviewed.  Constitutional:      Appearance: Normal appearance. He is obese. He is not  ill-appearing.  Eyes:     Extraocular Movements: Extraocular movements intact.     Conjunctiva/sclera: Conjunctivae normal.     Pupils: Pupils are equal, round, and reactive to light.  Neck:     Thyroid: No thyromegaly or thyroid tenderness.  Cardiovascular:     Rate and Rhythm: Normal rate and regular rhythm.     Pulses: Normal pulses.     Heart sounds: Normal heart sounds. No murmur.  Pulmonary:     Effort: Pulmonary effort is normal. No respiratory distress.     Breath sounds: Normal breath sounds. No wheezing, rhonchi or rales.  Musculoskeletal:     Cervical back: Normal range of motion and neck supple.     Right lower leg: No edema.     Left lower leg: No edema.    Psychiatric:        Mood and Affect: Mood normal.        Behavior: Behavior normal.        Thought Content: Thought content normal.        Judgment: Judgment normal.       Results for orders placed or performed in visit on 04/14/19  IBC panel  Result Value Ref Range   Iron 99 42 - 165 ug/dL   Transferrin 716.9 678.9 - 360.0 mg/dL   Saturation Ratios 38.1 20.0 - 50.0 %  CBC with Differential  Result Value Ref Range   WBC 5.7 4.0 - 10.5 K/uL   RBC 5.00 4.22 - 5.81 Mil/uL   Hemoglobin 15.3 13.0 - 17.0 g/dL   HCT 01.7 51.0 - 25.8 %   MCV 88.9 78.0 - 100.0 fl   MCHC 34.5 30.0 - 36.0 g/dL   RDW 52.7 78.2 - 42.3 %   Platelets 198.0 150.0 - 400.0 K/uL   Neutrophils Relative % 57.3 43.0 - 77.0 %   Lymphocytes Relative 29.8 12.0 - 46.0 %   Monocytes Relative 9.8 3.0 - 12.0 %   Eosinophils Relative 2.5 0.0 - 5.0 %   Basophils Relative 0.6 0.0 - 3.0 %   Neutro Abs 3.3 1.4 - 7.7 K/uL   Lymphs Abs 1.7 0.7 - 4.0 K/uL   Monocytes Absolute 0.6 0.1 - 1.0 K/uL   Eosinophils Absolute 0.1 0.0 - 0.7 K/uL   Basophils Absolute 0.0 0.0 - 0.1 K/uL  Hemochromatosis DNA-PCR(c282y,h63d)  Result Value Ref Range   DNA MUTATION ANALYSIS SEE NOTE    Lab Results  Component Value Date   TSH 2.27 04/12/2019    Depression screen [facility] 2/9 06/25/2019 02/23/2019  Decreased Interest 0 1  Down, Depressed, Hopeless 0 1  PHQ - 2 Score 0 2  Altered sleeping 2 2  Tired, decreased energy 1 3  Change in appetite 0 1  Feeling bad or failure about yourself  0 1  Trouble concentrating 2 3  Moving slowly or fidgety/restless 1 0  Suicidal thoughts 0 0  PHQ-9 Score 6 12    GAD 7 : Generalized Anxiety Score 06/25/2019 02/23/2019  Nervous, Anxious, on Edge 1 3  Control/stop worrying 1 3  Worry too much - different things 1 3  Trouble relaxing 1 3  Restless 1 2  Easily annoyed or irritable 2 3  Afraid - awful might happen 2 3  Total GAD 7 Score 9 20   Assessment & Plan:  This visit occurred during the  SARS-CoV-2 public health emergency.  Safety protocols were in place, including screening questions prior to the visit, additional usage of staff PPE,  and extensive cleaning of exam room while observing appropriate contact time as indicated for disinfecting solutions.  RTC 3 mo CPE Problem List Items Addressed This Visit    Subclinical hypothyroidism    Stable period on levothyroxine 25mcg daily - beta blocker initially worsened fatigue, tolerating 1/2 dose better. Reviewed correct administration of levothyroxine in relation to MVI (4 hours) and PPI (at least 2 hours).       Relevant Medications   metoprolol succinate (TOPROL-XL) 25 MG 24 hr tablet   Severe obesity (BMI 35.0-39.9) with comorbidity ([place])    Reviewed recent weight gain noted. He is motivated to implement healthy lifestyle changes to affect sustainable weight loss.      GERD (gastroesophageal reflux disease)    Managed with daily PPI.       Relevant Medications   omeprazole (PRILOSEC) 40 MG capsule   Essential hypertension - Primary    Chronic, improved on beta blocker. 50mg  dose was too strong and led to hypotension. Doing better on toprol XL 25mg  daily - will continue this.  Anticipate anxiety/stress contributing to his hypertension.       Relevant Medications   metoprolol succinate (TOPROL-XL) 25 MG 24 hr tablet   Anxiety    Overall improvement in anxiety levels without recent anxiety attack but work related stress persists. Metoprolol XL has helped with this. Hydroxyzine not very effective. Not currently interested in daily anxiety medication.  Has counseling available through work if desired.           Meds ordered this encounter  Medications  . metoprolol succinate (TOPROL-XL) 25 MG 24 hr tablet    Sig: Take 1 tablet (25 mg total) by mouth daily. Take with or immediately following a meal.    Dispense:  90 tablet    Refill:  1    Note new dose   No orders of the defined types were placed in this  encounter.   Patient Instructions  Continue metoprolol XL 25mg  once daily. Continue levothyroxine 40mcg daily.  Return in 3-4 months for follow up visit.    Follow up plan: Return in about 3 months (around 09/22/2019) for annual exam, prior fasting for blood work.  Ria Bush, MD

## 2019-06-25 NOTE — Patient Instructions (Addendum)
Continue metoprolol XL 25mg  once daily. Continue levothyroxine daily.  Return in 3-4 months for follow up visit.

## 2019-06-25 NOTE — Assessment & Plan Note (Signed)
Chronic, improved on beta blocker. 50mg  dose was too strong and led to hypotension. Doing better on toprol XL 25mg  daily - will continue this.  Anticipate anxiety/stress contributing to his hypertension.

## 2019-08-31 ENCOUNTER — Ambulatory Visit: Payer: Managed Care, Other (non HMO) | Admitting: Cardiovascular Disease

## 2019-09-22 ENCOUNTER — Other Ambulatory Visit: Payer: Self-pay | Admitting: Family Medicine

## 2019-09-22 NOTE — Telephone Encounter (Signed)
Patient's wife left a voicemail stating that her husband needs a refill on his levothyroxine and only has 3 days worth left. Melissa stated that the pharmacy has already sent the request over. Refill sent in electronically.

## 2019-09-23 ENCOUNTER — Other Ambulatory Visit: Payer: Managed Care, Other (non HMO)

## 2019-09-24 ENCOUNTER — Other Ambulatory Visit: Payer: Self-pay | Admitting: Family Medicine

## 2019-09-24 DIAGNOSIS — E785 Hyperlipidemia, unspecified: Secondary | ICD-10-CM

## 2019-09-24 DIAGNOSIS — R7989 Other specified abnormal findings of blood chemistry: Secondary | ICD-10-CM

## 2019-09-24 DIAGNOSIS — E038 Other specified hypothyroidism: Secondary | ICD-10-CM

## 2019-09-24 DIAGNOSIS — R7401 Elevation of levels of liver transaminase levels: Secondary | ICD-10-CM

## 2019-09-30 ENCOUNTER — Encounter: Payer: Managed Care, Other (non HMO) | Admitting: Family Medicine

## 2019-10-22 ENCOUNTER — Ambulatory Visit (INDEPENDENT_AMBULATORY_CARE_PROVIDER_SITE_OTHER): Payer: Managed Care, Other (non HMO) | Admitting: Cardiology

## 2019-10-22 ENCOUNTER — Encounter: Payer: Self-pay | Admitting: Cardiology

## 2019-10-22 ENCOUNTER — Other Ambulatory Visit: Payer: Self-pay

## 2019-10-22 VITALS — BP 124/90 | HR 76 | Ht 73.0 in | Wt 282.2 lb

## 2019-10-22 DIAGNOSIS — Z6837 Body mass index (BMI) 37.0-37.9, adult: Secondary | ICD-10-CM

## 2019-10-22 DIAGNOSIS — I10 Essential (primary) hypertension: Secondary | ICD-10-CM

## 2019-10-22 DIAGNOSIS — R0600 Dyspnea, unspecified: Secondary | ICD-10-CM | POA: Diagnosis not present

## 2019-10-22 DIAGNOSIS — R079 Chest pain, unspecified: Secondary | ICD-10-CM

## 2019-10-22 DIAGNOSIS — R0609 Other forms of dyspnea: Secondary | ICD-10-CM

## 2019-10-22 DIAGNOSIS — E7800 Pure hypercholesterolemia, unspecified: Secondary | ICD-10-CM

## 2019-10-22 MED ORDER — METOPROLOL TARTRATE 100 MG PO TABS
100.0000 mg | ORAL_TABLET | ORAL | 0 refills | Status: DC
Start: 2019-10-22 — End: 2020-03-20

## 2019-10-22 MED ORDER — NITROGLYCERIN 0.4 MG SL SUBL
0.4000 mg | SUBLINGUAL_TABLET | SUBLINGUAL | 2 refills | Status: DC | PRN
Start: 2019-10-22 — End: 2021-07-10

## 2019-10-22 NOTE — Progress Notes (Signed)
Cardiology Office Note:    Date:  10/22/2019   ID:  Antonio Ferguson, DOB 1975/05/22, MRN [id number]  PCP:  Ria Bush, MD  [facility] Cardiologist:  Kate Sable, MD  [place] Electrophysiologist:  None   Referring MD: Ria Bush, MD   Chief Complaint  Patient presents with  . New Patient (Initial Visit)    Patient report intermittent chest pain and DOE; Meds verbally reviewed with patient.    History of Present Illness:    Antonio Ferguson is a 44 y.o. male with a hx of hypertension, anxiety, obesity, GERD who presents due to chest pain.  Patient has been having chest tightness over the past 2 to 3 weeks.  Symptoms are not related with exertion.  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  He rates his chest discomfort as 5 out of 10 in occurrence lasting couple of minutes.  He also endorses shortness of breath with exertion over the past couple of months which has worsened.  He denies palpitations, edema.  States having family history of PAD in his father.  He denies smoking.  He is worried as a Dentist recently had a heart attack.  Will like to get checked out.  Prior echocardiogram on 06/2015 showed normal systolic function, EF 50 to 91%, normal diastolic function.  Past Medical History:  Diagnosis Date  . GERD (gastroesophageal reflux disease)    rare  . HLD (hyperlipidemia)   . NAFLD (nonalcoholic fatty liver disease) 2003   s/p [place] and liver biopsy  . OSA on CPAP 02/2014   Dr Camillo Flaming [facility]   . 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 COLONOSCOPY  2003   WNL  . FINGER SURGERY Right   . LIVER BIOPSY  2003   fatty liver  . THYROGLOSSAL DUCT CYST N/A 12/20/2013   Procedure: EXCISION THYROGLOSSAL DUCT CYST;  Surgeon: Izora Gala, MD;  Location: [facility];  Service: ENT;  Laterality: N/A;  . VASECTOMY   2009  . WISDOM TOOTH EXTRACTION      Current Medications: Current Meds  Medication Sig  . hydrOXYzine (ATARAX/VISTARIL) 25 MG tablet TAKE 0.5-1 TABLETS (12.5-25 MG TOTAL) BY MOUTH 2 (TWO) TIMES DAILY AS NEEDED FOR ANXIETY.  Marland Kitchen levothyroxine (SYNTHROID) 50 MCG tablet TAKE 1 TABLET BY MOUTH EVERY DAY  . metoprolol succinate (TOPROL-XL) 25 MG 24 hr tablet Take 1 tablet (25 mg total) by mouth daily. Take with or immediately following a meal.  . omeprazole (PRILOSEC) 40 MG capsule Take 1 capsule (40 mg total) by mouth daily.     Allergies:   Oxycodone-acetaminophen and Latex   Social History   Socioeconomic History  . Marital status: Married    Spouse name: Not on file  . Number of children: Not on file  . Years of education: Not on file  . Highest education level: Not on file  Occupational History  . Occupation: POLICE    Comment: [employer]  Tobacco Use  . Smoking status: Current Some Day Smoker    Types: Cigars  . Smokeless tobacco: Never Used  . Tobacco comment: 1 month per month  Vaping Use  . Vaping Use: Never used  Substance and Sexual Activity  . Alcohol use: Yes    Alcohol/week: 0.0 standard drinks    Comment: rare  . Drug use: No  . Sexual activity:  Not on file  Other Topics Concern  . Not on file  Social History Narrative   "[place]"   Lives with wife Investment banker, corporate) and 2 children, 3 dogs   Occupation: Engineer, structural [employer]   Edu: Associate's degree   Activity: training dogs, running   Diet: good water, fruits/vegetables some   Social Determinants of Radio broadcast assistant Strain:   . Difficulty of Paying Living Expenses:   Food Insecurity:   . Worried About Charity fundraiser in the Last Year:   . Arboriculturist in the Last Year:   Transportation Needs:   . Film/video editor (Medical):   Marland Kitchen Lack of Transportation (Non-Medical):   Physical Activity:   . Days of Exercise per Week:   . Minutes of Exercise per Session:   Stress:   .  Feeling of Stress :   Social Connections:   . Frequency of Communication with Friends and Family:   . Frequency of Social Gatherings with Friends and Family:   . Attends Religious Services:   . Active Member of Clubs or Organizations:   . Attends Archivist Meetings:   Marland Kitchen Marital Status:      Family History: The patient's family history includes CAD in his maternal uncle and paternal uncle; Cancer in his paternal grandmother; Diabetes in his father and paternal grandfather; Diverticulitis in his father; Healthy in his mother; Hyperlipidemia in his father; Hypertension in his father; Hypothyroidism in his brother; Peripheral Artery Disease in his father; Sudden Cardiac Death in his maternal uncle.  ROS:   Please see the history of present illness.     All other systems reviewed and are negative.  EKGs/Labs/Other Studies Reviewed:    The following studies were reviewed today:   EKG:  EKG is  ordered today.  The ekg ordered today demonstrates sinus rhythm, normal ECG.  Recent Labs: 02/23/2019: BUN 10; Creatinine, Ser 1.03; Potassium 4.5; Sodium 140 04/12/2019: ALT 128; TSH 2.27 04/14/2019: Hemoglobin 15.3; Platelets 198.0  Recent Lipid Panel    Component Value Date/Time   CHOL 215 (H) 04/12/2019 1100   CHOL 192 12/05/2014 0000   TRIG 342.0 (H) 04/12/2019 1100   TRIG 322 12/05/2014 0000   HDL 36.10 (L) 04/12/2019 1100   CHOLHDL 6 04/12/2019 1100   VLDL 68.4 (H) 04/12/2019 1100   LDLCALC 94 08/28/2015 0827   LDLCALC 94 12/05/2014 0000   LDLDIRECT 102.0 04/12/2019 1100    Physical Exam:    VS:  BP 124/90 (BP Location: Right Arm, Patient Position: Sitting, Cuff Size: Large)   Pulse 76   Ht 6' 1"  (1.854 m)   Wt 282 lb 4 oz (128 kg)   SpO2 95%   BMI 37.24 kg/m     Wt Readings from Last 3 Encounters:  10/22/19 282 lb 4 oz (128 kg)  06/25/19 288 lb (130.6 kg)  02/23/19 270 lb (122.5 kg)     GEN:  Well nourished, well developed in no acute distress HEENT:  Normal NECK: No JVD; No carotid bruits LYMPHATICS: No lymphadenopathy CARDIAC: RRR, no murmurs, rubs, gallops RESPIRATORY:  Clear to auscultation without rales, wheezing or rhonchi  ABDOMEN: Soft, non-tender, non-distended MUSCULOSKELETAL:  No edema; No deformity  SKIN: Warm and dry NEUROLOGIC:  Alert and oriented x 3 PSYCHIATRIC:  Normal affect   ASSESSMENT:    1. Chest pain of uncertain etiology   2. Dyspnea on exertion   3. Essential hypertension   4. Pure hypercholesterolemia  5. BMI 37.0-37.9, adult   6. Chest pain, unspecified type    PLAN:    In order of problems listed above:  1. Patient with atypical chest discomfort and also shortness of breath on exertion.  He has risk factors of obesity, hypertension, hyperlipidemia.  Will get echocardiogram to evaluate cardiac function, get coronary CTA to evaluate presence of CAD.  Sublingual nitro will be given to patient to take as needed chest pain until testing is done. 2. Dyspnea on exertion.  Get echocardiogram and CTA coronary as above.  Deconditioning could be contributing. 3. History of hypertension, blood pressure controlled.  Continue Toprol-XL as prescribed. 4. History of hyperlipidemia, 10-year ASCVD is 2.6%.  Low-cholesterol diet and exercise advised.  Patient not in statin benefit group. 5. Patient is obese, weight loss, low-calorie diet advised.  Follow-up after echocardiogram and coronary CTA.  Total encounter time 60 minutes  Greater than 50% was spent in counseling and coordination of care with the patient   This note was generated in part or whole with voice recognition software. Voice recognition is usually quite accurate but there are transcription errors that can and very often do occur. I apologize for any typographical errors that were not detected and corrected.  Medication Adjustments/Labs and Tests Ordered: Current medicines are reviewed at length with the patient today.  Concerns regarding medicines  are outlined above.  Orders Placed This Encounter  Procedures  . CT CORONARY MORPH W/CTA COR W/SCORE W/CA W/CM &/OR WO/CM  . CT CORONARY FRACTIONAL FLOW RESERVE DATA PREP  . CT CORONARY FRACTIONAL FLOW RESERVE FLUID ANALYSIS  . EKG 12-Lead  . ECHOCARDIOGRAM COMPLETE   Meds ordered this encounter  Medications  . nitroGLYCERIN (NITROSTAT) 0.4 MG SL tablet    Sig: Place 1 tablet (0.4 mg total) under the tongue every 5 (five) minutes as needed for chest pain.    Dispense:  25 tablet    Refill:  2  . metoprolol tartrate (LOPRESSOR) 100 MG tablet    Sig: Take 1 tablet (100 mg total) by mouth as directed. Take 2 hours prior to testing.    Dispense:  1 tablet    Refill:  0    Patient Instructions  Medication Instructions:  Your physician has recommended you make the following change in your medication:  1. Nitroglycerin 0.4 mg under the tongue every 5 minutes as needed for chest pain.   *If you need a refill on your cardiac medications before your next appointment, please call your pharmacy*   Lab Work: None  If you have labs (blood work) drawn today and your tests are completely normal, you will receive your results only by: Marland Kitchen MyChart Message (if you have MyChart) OR . A paper copy in the mail If you have any lab test that is abnormal or we need to change your treatment, we will call you to review the results.   Testing/Procedures: Your cardiac CT will be scheduled at one of the below locations:   [facility] [address] [place], [place] [address] [phone]  [place] [address] [place],  [address] [phone]  If scheduled at [facility], please arrive at the [facility] main entrance of [facility] 30 minutes prior to test start time. Proceed to the [facility] Radiology Department (first floor) to check-in and test prep.  If scheduled at [facility], please arrive 15 mins early for check-in and test  prep.  Please follow these instructions carefully (unless otherwise directed):  Hold all erectile dysfunction medications at least 3 days (72 hrs) prior to test.  On the Night Before the Test: . Be sure to Drink plenty of water. . Do not consume any caffeinated/decaffeinated beverages or chocolate 12 hours prior to your test. . Do not take any antihistamines 12 hours prior to your test.   On the Day of the Test: . Drink plenty of water. Do not drink any water within one hour of the test. . Do not eat any food 4 hours prior to the test. . You may take your regular medications prior to the test.  . Take metoprolol (Lopressor) two hours prior to test. . HOLD Furosemide/Hydrochlorothiazide morning of the test.        After the Test: . Drink plenty of water. . After receiving IV contrast, you may experience a mild flushed feeling. This is normal. . On occasion, you may experience a mild rash up to 24 hours after the test. This is not dangerous. If this occurs, you can take Benadryl 25 mg and increase your fluid intake. . If you experience trouble breathing, this can be serious. If it is severe call 911 IMMEDIATELY. If it is mild, please call our office. . If you take any of these medications: Glipizide/Metformin, Avandament, Glucavance, please do not take 48 hours after completing test unless otherwise instructed.   Once we have confirmed authorization from your insurance company, we will call you to set up a date and time for your test.   For non-scheduling related questions, please contact the cardiac imaging nurse navigator should you have any questions/concerns: Marchia Bond, Cardiac Imaging Nurse Navigator Burley Saver, Interim Cardiac Imaging Nurse [place] and Vascular Services Direct Office Dial: [phone]   For scheduling needs, including cancellations and rescheduling, please call [phone].       Follow-Up: At [facility], you and your health needs are our priority.  As part of our continuing mission to provide you with exceptional heart care, we have created designated Provider Care Teams.  These Care Teams include your primary Cardiologist (physician) and Advanced Practice Providers (APPs -  Physician Assistants and Nurse Practitioners) who all work together to provide you with the care you need, when you need it.   Your next appointment:   Follow up after testing  The format for your next appointment:   In Person  Provider:    You may see Kate Sable, MD or one of the following Advanced Practice Providers on your designated Care Team:    Murray Hodgkins, NP  Christell Faith, PA-C  Marrianne Mood, PA-C      Signed, Kate Sable, MD  10/22/2019 9:32 AM    [place]

## 2019-10-22 NOTE — Patient Instructions (Addendum)
Medication Instructions:  Your physician has recommended you make the following change in your medication:  1. Nitroglycerin 0.4 mg under the tongue every 5 minutes as needed for chest pain.   *If you need a refill on your cardiac medications before your next appointment, please call your pharmacy*   Lab Work: None  If you have labs (blood work) drawn today and your tests are completely normal, you will receive your results only by: Marland Kitchen MyChart Message (if you have MyChart) OR . A paper copy in the mail If you have any lab test that is abnormal or we need to change your treatment, we will call you to review the results.   Testing/Procedures: Your cardiac CT will be scheduled at one of the below locations:   [facility] [address] [place], [place] [address] [phone]  [place] [address] [place], [place] [address] [phone]  If scheduled at [facility], please arrive at the [facility] main entrance of [facility] 30 minutes prior to test start time. Proceed to the [facility] Radiology Department (first floor) to check-in and test prep.  If scheduled at [facility], please arrive 15 mins early for check-in and test prep.  Please follow these instructions carefully (unless otherwise directed):  Hold all erectile dysfunction medications at least 3 days (72 hrs) prior to test.  On the Night Before the Test: . Be sure to Drink plenty of water. . Do not consume any caffeinated/decaffeinated beverages or chocolate 12 hours prior to your test. . Do not take any antihistamines 12 hours prior to your test.   On the Day of the Test: . Drink plenty of water. Do not drink any water within one hour of the test. . Do not eat any food 4 hours prior to the test. . You may take your regular medications prior to the test.  . Take metoprolol (Lopressor) two hours prior  to test. . HOLD Furosemide/Hydrochlorothiazide morning of the test.        After the Test: . Drink plenty of water. . After receiving IV contrast, you may experience a mild flushed feeling. This is normal. . On occasion, you may experience a mild rash up to 24 hours after the test. This is not dangerous. If this occurs, you can take Benadryl 25 mg and increase your fluid intake. . If you experience trouble breathing, this can be serious. If it is severe call 911 IMMEDIATELY. If it is mild, please call our office. . If you take any of these medications: Glipizide/Metformin, Avandament, Glucavance, please do not take 48 hours after completing test unless otherwise instructed.   Once we have confirmed authorization from your insurance company, we will call you to set up a date and time for your test.   For non-scheduling related questions, please contact the cardiac imaging nurse navigator should you have any questions/concerns: Marchia Bond, Cardiac Imaging Nurse Navigator Burley Saver, Interim Cardiac Imaging Nurse [place] and Vascular Services Direct Office Dial: [phone]   For scheduling needs, including cancellations and rescheduling, please call [phone].      Follow-Up: At [facility], you and your health needs are our priority.  As part of our continuing mission to provide you with exceptional heart care, we have created designated Provider Care Teams.  These Care Teams include your primary Cardiologist (physician) and Advanced Practice Providers (APPs -  Physician Assistants and Nurse  Practitioners) who all work together to provide you with the care you need, when you need it.   Your next appointment:   Follow up after testing  The format for your next appointment:   In Person  Provider:    You may see Kate Sable, MD or one of the following Advanced Practice Providers on your designated Care Team:    Murray Hodgkins, NP  Christell Faith,  PA-C  Marrianne Mood, PA-C

## 2019-11-09 ENCOUNTER — Telehealth (HOSPITAL_COMMUNITY): Payer: Self-pay | Admitting: *Deleted

## 2019-11-09 NOTE — Telephone Encounter (Signed)
Reaching out to patient to offer assistance regarding upcoming cardiac imaging study; pt verbalizes understanding of appt date/time, parking situation and where to check in, pre-test NPO status and medications ordered, and verified current allergies; name and call back number provided for further questions should they arise  Tyannah Sane Tai RN Navigator Cardiac Imaging [place] [facility] [phone] office [phone] cell 

## 2019-11-11 ENCOUNTER — Ambulatory Visit
Admission: RE | Admit: 2019-11-11 | Discharge: 2019-11-11 | Disposition: A | Discharge status: Home / Self Care | Payer: Managed Care, Other (non HMO) | Source: Ambulatory Visit | Attending: Cardiology | Admitting: Cardiology

## 2019-11-11 ENCOUNTER — Other Ambulatory Visit: Payer: Self-pay

## 2019-11-11 DIAGNOSIS — R079 Chest pain, unspecified: Secondary | ICD-10-CM | POA: Diagnosis present

## 2019-11-11 HISTORY — DX: Gastro-esophageal reflux disease without esophagitis: K21.9

## 2019-11-11 HISTORY — DX: Essential (primary) hypertension: I10

## 2019-11-11 HISTORY — DX: Hypothyroidism, unspecified: E03.9

## 2019-11-11 LAB — POCT I-STAT CREATININE: Creatinine, Ser: 1 mg/dL (ref 0.61–1.24)

## 2019-11-11 MED ORDER — DILTIAZEM HCL 25 MG/5ML IV SOLN
10.0000 mg | Freq: Once | INTRAVENOUS | Status: AC
  Administered 2019-11-11: 10 mg via INTRAVENOUS

## 2019-11-11 MED ORDER — DILTIAZEM HCL 25 MG/5ML IV SOLN
5.0000 mg | Freq: Once | INTRAVENOUS | Status: AC
  Administered 2019-11-11: 5 mg via INTRAVENOUS

## 2019-11-11 MED ORDER — DILTIAZEM HCL 25 MG/5ML IV SOLN: 10.0000 mg | Freq: Once | INTRAVENOUS | Status: DC

## 2019-11-11 MED ORDER — NITROGLYCERIN 0.4 MG SL SUBL
0.8000 mg | SUBLINGUAL_TABLET | Freq: Once | SUBLINGUAL | Status: AC
  Administered 2019-11-11: 0.8 mg via SUBLINGUAL

## 2019-11-11 MED ORDER — NITROGLYCERIN 0.4 MG SL SUBL: 0.4000 mg | SUBLINGUAL_TABLET | SUBLINGUAL | Status: DC | PRN

## 2019-11-11 MED ORDER — IOHEXOL 350 MG/ML SOLN
100.0000 mL | Freq: Once | INTRAVENOUS | Status: AC | PRN
  Administered 2019-11-11: 100 mL via INTRAVENOUS

## 2019-11-11 NOTE — Progress Notes (Signed)
Patient tolerated procedure well. Ambulate w/o difficulty. Sitting in chair drinking. No needs. All questions answered. ABC intact. Discharge from procedure area w/o issues. 

## 2019-11-11 NOTE — Progress Notes (Signed)
Patient tolerated CT well. Gave water bottle to drink.Ambulatory steady gait to exit.

## 2019-11-12 ENCOUNTER — Telehealth: Payer: Self-pay | Admitting: Cardiology

## 2019-11-12 NOTE — Telephone Encounter (Signed)
Spoke with patient and reviewed result notes. Patient verbalized understanding and was grateful for the call back.  Sent Heart Healthy Eating Education through [employer] as requested by the patient. He is very motivated going forward on changing the way he eats.

## 2019-11-12 NOTE — Telephone Encounter (Signed)
Patient calling in to get test results from earlier this week

## 2019-11-30 ENCOUNTER — Ambulatory Visit (INDEPENDENT_AMBULATORY_CARE_PROVIDER_SITE_OTHER): Payer: Managed Care, Other (non HMO)

## 2019-11-30 ENCOUNTER — Other Ambulatory Visit: Payer: Self-pay

## 2019-11-30 DIAGNOSIS — R0600 Dyspnea, unspecified: Secondary | ICD-10-CM

## 2019-11-30 DIAGNOSIS — R079 Chest pain, unspecified: Secondary | ICD-10-CM | POA: Diagnosis not present

## 2019-11-30 DIAGNOSIS — R0609 Other forms of dyspnea: Secondary | ICD-10-CM

## 2019-11-30 LAB — ECHOCARDIOGRAM COMPLETE
AR max vel: 4.24 cm2
AV Area VTI: 4.38 cm2
AV Area mean vel: 3.94 cm2
AV Mean grad: 3 mmHg
AV Peak grad: 5.1 mmHg
Ao pk vel: 1.13 m/s
Area-P 1/2: 3.39 cm2
Calc EF: 45.2 %
S' Lateral: 3.6 cm
Single Plane A2C EF: 43.9 %
Single Plane A4C EF: 45.2 %

## 2019-12-02 ENCOUNTER — Other Ambulatory Visit: Payer: Self-pay

## 2019-12-02 ENCOUNTER — Ambulatory Visit: Payer: Managed Care, Other (non HMO) | Admitting: Cardiology

## 2019-12-02 ENCOUNTER — Encounter: Payer: Self-pay | Admitting: Cardiology

## 2019-12-02 ENCOUNTER — Other Ambulatory Visit: Payer: Self-pay | Admitting: Family Medicine

## 2019-12-02 VITALS — BP 130/90 | HR 66 | Ht 73.0 in | Wt 278.1 lb

## 2019-12-02 DIAGNOSIS — R079 Chest pain, unspecified: Secondary | ICD-10-CM | POA: Diagnosis not present

## 2019-12-02 DIAGNOSIS — E7800 Pure hypercholesterolemia, unspecified: Secondary | ICD-10-CM

## 2019-12-02 DIAGNOSIS — I10 Essential (primary) hypertension: Secondary | ICD-10-CM | POA: Diagnosis not present

## 2019-12-02 NOTE — Telephone Encounter (Signed)
E-scribed refill.  Plz schedule cpe and lab visits.  

## 2019-12-02 NOTE — Progress Notes (Signed)
Cardiology Office Note:    Date:  12/02/2019   ID:  Antonio Ferguson Kinch, DOB 1976/03/14, MRN [id number]  PCP:  Eustaquio BoydenGutierrez, Javier, MD  [facility] Cardiologist:  Debbe OdeaBrian Agbor-Etang, MD  [facility] Electrophysiologist:  None   Referring MD: Eustaquio BoydenGutierrez, Javier, MD   Chief Complaint  Patient presents with  . office visit    F/U after cardiac testing; Meds verbally reviewed with patient.    History of Present Illness:    Antonio Ferguson Manolis is a 44 y.o. male with a hx of hypertension, anxiety, obesity, GERD who presents for follow-up.  Patient was last seen due to atypical chest pain.  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  Due to risk factors and patient symptoms, echocardiogram, coronary CTA was performed to evaluate presence of CAD.  He notes having occasional chest discomfort, also increased heart rates which have improved since starting Toprol-XL.  His blood pressure fluctuates depending on if he is anxious or not.  He now presents for results.  Prior echocardiogram on 06/2015 showed normal systolic function, EF 50 to 55%, normal diastolic function.  Past Medical History:  Diagnosis Date  . Acid reflux   . GERD (gastroesophageal reflux disease)    rare  . HLD (hyperlipidemia)   . Hypertension   . Hypothyroid   . NAFLD (nonalcoholic fatty liver disease) [id number]   s/p US and liver biopsy  . OSA on CPAP 02/2014   Dr Su MonksEjaz [facility]   . 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 COLONOSCOPY  2003   WNL  . FINGER SURGERY Right   . LIVER BIOPSY  2003   fatty liver  . THYROGLOSSAL DUCT CYST N/A 12/20/2013   Procedure: EXCISION THYROGLOSSAL DUCT CYST;  Surgeon: Serena ColonelJefry Rosen, MD;  Location: [facility];  Service: ENT;  Laterality: N/A;  . VASECTOMY  2009  . WISDOM TOOTH EXTRACTION      Current Medications: Current Meds  Medication Sig  . hydrOXYzine (ATARAX/VISTARIL) 25 MG tablet TAKE 0.5-1  TABLETS (12.5-25 MG TOTAL) BY MOUTH 2 (TWO) TIMES DAILY AS NEEDED FOR ANXIETY.  Marland Kitchen. levothyroxine (SYNTHROID) 50 MCG tablet TAKE 1 TABLET BY MOUTH EVERY DAY  . metoprolol succinate (TOPROL-XL) 25 MG 24 hr tablet Take 1 tablet (25 mg total) by mouth daily. Take with or immediately following a meal.  . nitroGLYCERIN (NITROSTAT) 0.4 MG SL tablet Place 1 tablet (0.4 mg total) under the tongue every 5 (five) minutes as needed for chest pain.  Marland Kitchen. omeprazole (PRILOSEC) 40 MG capsule Take 1 capsule (40 mg total) by mouth daily.     Allergies:   Oxycodone-acetaminophen and Latex   Social History   Socioeconomic History  . Marital status: Married    Spouse name: Not on file  . Number of children: Not on file  . Years of education: Not on file  . Highest education level: Not on file  Occupational History  . Occupation: POLICE    Comment: [employer]  Tobacco Use  . Smoking status: Current Some Day Smoker    Types: Cigars  . Smokeless tobacco: Never Used  . Tobacco comment: 1 month per month  Vaping Use  . Vaping Use: Never used  Substance and Sexual Activity  . Alcohol use: Yes    Alcohol/week: 0.0 standard drinks    Comment: rare  . Drug use: No  . Sexual activity: Not on file  Other Topics  Concern  . Not on file  Social History Narrative   "[place]rick"   Lives with wife Banker) and 2 children, 3 dogs   Occupation: Emergency planning/management officer [employer]   Edu: Associate's degree   Activity: training dogs, running   Diet: good water, fruits/vegetables some   Social Determinants of Corporate investment banker Strain:   . Difficulty of Paying Living Expenses:   Food Insecurity:   . Worried About Programme researcher, broadcasting/film/video in the Last Year:   . Barista in the Last Year:   Transportation Needs:   . Freight forwarder (Medical):   Marland Kitchen Lack of Transportation (Non-Medical):   Physical Activity:   . Days of Exercise per Week:   . Minutes of Exercise per Session:   Stress:   . Feeling  of Stress :   Social Connections:   . Frequency of Communication with Friends and Family:   . Frequency of Social Gatherings with Friends and Family:   . Attends Religious Services:   . Active Member of Clubs or Organizations:   . Attends Banker Meetings:   Marland Kitchen Marital Status:      Family History: The patient's family history includes CAD in his maternal uncle and paternal uncle; Cancer in his paternal grandmother; Diabetes in his father and paternal grandfather; Diverticulitis in his father; Healthy in his mother; Hyperlipidemia in his father; Hypertension in his father; Hypothyroidism in his brother; Peripheral Artery Disease in his father; Sudden Cardiac Death in his maternal uncle.  ROS:   Please see the history of present illness.     All other systems reviewed and are negative.  EKGs/Labs/Other Studies Reviewed:    The following studies were reviewed today:   EKG:  EKG is  ordered today.  The ekg ordered today demonstrates sinus rhythm, normal ECG.  Recent Labs: 02/23/2019: BUN 10; Potassium 4.5; Sodium 140 04/12/2019: ALT 128; TSH 2.27 04/14/2019: Hemoglobin 15.3; Platelets 198.0 11/11/2019: Creatinine, Ser 1.00  Recent Lipid Panel    Component Value Date/Time   CHOL 215 (H) 04/12/2019 1100   CHOL 192 12/05/2014 0000   TRIG 342.0 (H) 04/12/2019 1100   TRIG 322 12/05/2014 0000   HDL 36.10 (L) 04/12/2019 1100   CHOLHDL 6 04/12/2019 1100   VLDL 68.4 (H) 04/12/2019 1100   LDLCALC 94 08/28/2015 0827   LDLCALC 94 12/05/2014 0000   LDLDIRECT 102.0 04/12/2019 1100    Physical Exam:    VS:  BP (!) 130/90 (BP Location: Left Arm, Patient Position: Sitting, Cuff Size: Large)   Pulse 66   Ht 6\' 1"  (1.854 m)   Wt (!) 278 lb 2 oz (126.2 kg)   SpO2 99%   BMI 36.69 kg/m     Wt Readings from Last 3 Encounters:  12/02/19 (!) 278 lb 2 oz (126.2 kg)  10/22/19 282 lb 4 oz (128 kg)  06/25/19 288 lb (130.6 kg)     GEN:  Well nourished, well developed in no acute  distress HEENT: Normal NECK: No JVD; No carotid bruits LYMPHATICS: No lymphadenopathy CARDIAC: RRR, no murmurs, rubs, gallops RESPIRATORY:  Clear to auscultation without rales, wheezing or rhonchi  ABDOMEN: Soft, non-tender, non-distended MUSCULOSKELETAL:  No edema; No deformity  SKIN: Warm and dry NEUROLOGIC:  Alert and oriented x 3 PSYCHIATRIC:  Normal affect   ASSESSMENT:    1. Chest pain of uncertain etiology   2. Essential hypertension   3. Pure hypercholesterolemia    PLAN:    In  order of problems listed above:  1. Patient with atypical chest discomfort and also shortness of breath on exertion.  He has risk factors of obesity, hypertension, hyperlipidemia.  Echocardiogram reviewed by myself showed normal systolic and diastolic function, EF 55%.  Normal PA sinus on coronary CT.  Coronary CT angiogram showed a low calcium score of 88.7, minimal calcified plaque in the proximal LAD causing minimal nonobstructive (0 to 24%) disease.  Patient reassured.  Plan to follow-up with primary care provider regarding anxiety management. 2. History of hypertension, blood pressure controlled.  Continue Toprol-XL as prescribed. 3. History of hyperlipidemia, 10-year ASCVD is 2.6%.  Low-cholesterol diet and exercise advised.  Patient not in statin benefit group.  Follow-up in 6 months  Total encounter time 70 minutes  Greater than 50% was spent in counseling and coordination of care with the patient Time spent on but not limited to explaining in detail the results of echocardiogram and coronary CT including getting measurements and explaining why there was a discrepancy between echo reads and my findings.  Patient and wife had very insightful questions which were all answered.   This note was generated in part or whole with voice recognition software. Voice recognition is usually quite accurate but there are transcription errors that can and very often do occur. I apologize for any typographical  errors that were not detected and corrected.  Medication Adjustments/Labs and Tests Ordered: Current medicines are reviewed at length with the patient today.  Concerns regarding medicines are outlined above.  Orders Placed This Encounter  Procedures  . EKG 12-Lead   No orders of the defined types were placed in this encounter.   Patient Instructions  Medication Instructions:  Your physician recommends that you continue on your current medications as directed. Please refer to the Current Medication list given to you today.  *If you need a refill on your cardiac medications before your next appointment, please call your pharmacy*   Lab Work: None ordered  If you have labs (blood work) drawn today and your tests are completely normal, you will receive your results only by: Marland Kitchen MyChart Message (if you have MyChart) OR . A paper copy in the mail If you have any lab test that is abnormal or we need to change your treatment, we will call you to review the results.   Testing/Procedures: None ordered    Follow-Up: At [facility], you and your health needs are our priority.  As part of our continuing mission to provide you with exceptional heart care, we have created designated Provider Care Teams.  These Care Teams include your primary Cardiologist (physician) and Advanced Practice Providers (APPs -  Physician Assistants and Nurse Practitioners) who all work together to provide you with the care you need, when you need it.  We recommend signing up for the patient portal called "MyChart".  Sign up information is provided on this After Visit Summary.  MyChart is used to connect with patients for Virtual Visits (Telemedicine).  Patients are able to view lab/test results, encounter notes, upcoming appointments, etc.  Non-urgent messages can be sent to your provider as well.   To learn more about what you can do with MyChart, go to [email or web address].    Your next appointment:   6  month(s)  The format for your next appointment:   In Person  Provider:    You may see Debbe Odea, MD or one of the following Advanced Practice Providers on your designated Care Team:    Cristal Deer  Brion Aliment, NP  Eula Listen, PA-C  Marisue Ivan, PA-C      Signed, Debbe Odea, MD  12/02/2019 12:54 PM    [facility]

## 2019-12-02 NOTE — Patient Instructions (Signed)
Medication Instructions:  Your physician recommends that you continue on your current medications as directed. Please refer to the Current Medication list given to you today.  *If you need a refill on your cardiac medications before your next appointment, please call your pharmacy*   Lab Work: None ordered If you have labs (blood work) drawn today and your tests are completely normal, you will receive your results only by: . MyChart Message (if you have MyChart) OR . A paper copy in the mail If you have any lab test that is abnormal or we need to change your treatment, we will call you to review the results.   Testing/Procedures: None ordered   Follow-Up: At [facility], you and your health needs are our priority.  As part of our continuing mission to provide you with exceptional heart care, we have created designated Provider Care Teams.  These Care Teams include your primary Cardiologist (physician) and Advanced Practice Providers (APPs -  Physician Assistants and Nurse Practitioners) who all work together to provide you with the care you need, when you need it.  We recommend signing up for the patient portal called "MyChart".  Sign up information is provided on this After Visit Summary.  MyChart is used to connect with patients for Virtual Visits (Telemedicine).  Patients are able to view lab/test results, encounter notes, upcoming appointments, etc.  Non-urgent messages can be sent to your provider as well.   To learn more about what you can do with MyChart, go to [email or web address].com.    Your next appointment:   6 month(s)  The format for your next appointment:   In Person  Provider:    You may see Brian Agbor-Etang, MD or one of the following Advanced Practice Providers on your designated Care Team:    Christopher Berge, NP  Ryan Dunn, PA-C  Jacquelyn Visser, PA-C   

## 2019-12-08 NOTE — Telephone Encounter (Signed)
Called patient and left voicemail for patient to call office and get scheduled for cpe and labs.

## 2019-12-10 ENCOUNTER — Ambulatory Visit: Payer: Managed Care, Other (non HMO) | Admitting: Cardiology

## 2019-12-15 ENCOUNTER — Telehealth: Payer: Self-pay | Admitting: Family Medicine

## 2019-12-15 NOTE — Telephone Encounter (Signed)
Plz schedule cpe and lab visits.  Then return encounter to me for refill.  

## 2019-12-16 NOTE — Telephone Encounter (Signed)
Noted.  E-scribed refill.   Plz send letter if has not scheduled after 2 more called attempts to reach him.

## 2019-12-16 NOTE — Telephone Encounter (Signed)
Called patient again and left a message for him to call office and get scheduled for CPE and labs.

## 2019-12-23 ENCOUNTER — Encounter: Payer: Self-pay | Admitting: Family Medicine

## 2020-01-13 NOTE — Telephone Encounter (Signed)
Noted  

## 2020-01-13 NOTE — Telephone Encounter (Signed)
Letter was mailed on 12/23/2019.

## 2020-02-08 ENCOUNTER — Other Ambulatory Visit: Payer: Self-pay | Admitting: Family Medicine

## 2020-03-19 ENCOUNTER — Other Ambulatory Visit: Payer: Self-pay | Admitting: Family Medicine

## 2020-03-20 ENCOUNTER — Telehealth: Payer: Self-pay | Admitting: *Deleted

## 2020-03-20 MED ORDER — AMLODIPINE BESYLATE 5 MG PO TABS: 5.0000 mg | ORAL_TABLET | Freq: Every day | ORAL | 3 refills | Status: DC

## 2020-03-20 NOTE — Telephone Encounter (Signed)
Called wife, unable to reach.  Called patient - discussed below.  rec add amlodipine 5mg  daily - sent to pharmacy.  Will reassess at upcoming CPE  BP Readings from Last 3 Encounters:  12/02/19 (!) 130/90  11/11/19 117/73  10/22/19 124/90

## 2020-03-20 NOTE — Telephone Encounter (Signed)
Patient's wife Melissa left a voicemail stating that her husband has a CPE scheduled for next week. Melissa stated that she is not sure if her husband should be seen sooner or not.  Melissa stated that her husband had a headache this morning so she checked his blood pressure manually and it was 160/98 left arm, 160/90 right arm. Patient's wife stated that she checked it about an hour and a half laster at 8:00 and it was 160/90 in both arms. Melissa stated that she is a Engineer, civil (consulting). Melissa stated that she then checked her husband blood pressure around 10:40 am and it is still running 160/170 over 110. Melissa stated that her husband does not have a headache now and no other symptoms. Melissa stated that he is taking metoprolol 25 mg which has been helping with his blood pressure and anxiety. Melissa stated that her husband has tried Toprol 50 mg which made him feel really bad, dropped his blood pressure and heart rate too much. Melissa stated that she would like Dr. Sharen Hones to be aware of this and to call her husband back or her.

## 2020-03-28 ENCOUNTER — Encounter: Payer: Self-pay | Admitting: Family Medicine

## 2020-03-29 ENCOUNTER — Other Ambulatory Visit: Payer: Self-pay

## 2020-03-29 ENCOUNTER — Other Ambulatory Visit (INDEPENDENT_AMBULATORY_CARE_PROVIDER_SITE_OTHER): Payer: Managed Care, Other (non HMO)

## 2020-03-29 DIAGNOSIS — E785 Hyperlipidemia, unspecified: Secondary | ICD-10-CM

## 2020-03-29 DIAGNOSIS — R7401 Elevation of levels of liver transaminase levels: Secondary | ICD-10-CM | POA: Diagnosis not present

## 2020-03-29 DIAGNOSIS — R7989 Other specified abnormal findings of blood chemistry: Secondary | ICD-10-CM

## 2020-03-29 DIAGNOSIS — E038 Other specified hypothyroidism: Secondary | ICD-10-CM | POA: Diagnosis not present

## 2020-03-29 LAB — COMPREHENSIVE METABOLIC PANEL
ALT: 89 U/L — ABNORMAL HIGH (ref 0–53)
AST: 37 U/L (ref 0–37)
Albumin: 4.3 g/dL (ref 3.5–5.2)
Alkaline Phosphatase: 94 U/L (ref 39–117)
BUN: 13 mg/dL (ref 6–23)
CO2: 30 mEq/L (ref 19–32)
Calcium: 9.3 mg/dL (ref 8.4–10.5)
Chloride: 102 mEq/L (ref 96–112)
Creatinine, Ser: 0.92 mg/dL (ref 0.40–1.50)
GFR: 102.93 mL/min (ref >60.00)
Glucose, Bld: 95 mg/dL (ref 70–99)
Potassium: 4.5 mEq/L (ref 3.5–5.1)
Sodium: 138 mEq/L (ref 135–145)
Total Bilirubin: 0.7 mg/dL (ref 0.2–1.2)
Total Protein: 6.6 g/dL (ref 6.0–8.3)

## 2020-03-29 LAB — LIPID PANEL
Cholesterol: 168 mg/dL (ref 0–200)
HDL: 36.2 mg/dL — ABNORMAL LOW (ref >39.00)
NonHDL: 131.76
Total CHOL/HDL Ratio: 5
Triglycerides: 223 mg/dL — ABNORMAL HIGH (ref 0.0–149.0)
VLDL: 44.6 mg/dL — ABNORMAL HIGH (ref 0.0–40.0)

## 2020-03-29 LAB — FERRITIN: Ferritin: 348.3 ng/mL — ABNORMAL HIGH (ref 22.0–322.0)

## 2020-03-29 LAB — LDL CHOLESTEROL, DIRECT: Direct LDL: 98 mg/dL

## 2020-03-29 LAB — T4, FREE: Free T4: 0.97 ng/dL (ref 0.60–1.60)

## 2020-03-29 LAB — TSH: TSH: 2.14 u[IU]/mL (ref 0.35–4.50)

## 2020-03-29 NOTE — Telephone Encounter (Signed)
BP check today for lab visit 150/90 R arm large

## 2020-03-31 ENCOUNTER — Other Ambulatory Visit: Payer: Self-pay

## 2020-03-31 ENCOUNTER — Ambulatory Visit (INDEPENDENT_AMBULATORY_CARE_PROVIDER_SITE_OTHER): Payer: Managed Care, Other (non HMO) | Admitting: Family Medicine

## 2020-03-31 ENCOUNTER — Encounter: Payer: Self-pay | Admitting: Family Medicine

## 2020-03-31 VITALS — BP 122/74 | HR 87 | Temp 97.7°F | Ht 71.25 in | Wt 271.6 lb

## 2020-03-31 DIAGNOSIS — I10 Essential (primary) hypertension: Secondary | ICD-10-CM

## 2020-03-31 DIAGNOSIS — K219 Gastro-esophageal reflux disease without esophagitis: Secondary | ICD-10-CM

## 2020-03-31 DIAGNOSIS — E038 Other specified hypothyroidism: Secondary | ICD-10-CM | POA: Diagnosis not present

## 2020-03-31 DIAGNOSIS — R7989 Other specified abnormal findings of blood chemistry: Secondary | ICD-10-CM

## 2020-03-31 DIAGNOSIS — Z0000 Encounter for general adult medical examination without abnormal findings: Secondary | ICD-10-CM | POA: Diagnosis not present

## 2020-03-31 DIAGNOSIS — Z9989 Dependence on other enabling machines and devices: Secondary | ICD-10-CM

## 2020-03-31 DIAGNOSIS — G4733 Obstructive sleep apnea (adult) (pediatric): Secondary | ICD-10-CM

## 2020-03-31 DIAGNOSIS — Z23 Encounter for immunization: Secondary | ICD-10-CM | POA: Diagnosis not present

## 2020-03-31 DIAGNOSIS — R7401 Elevation of levels of liver transaminase levels: Secondary | ICD-10-CM

## 2020-03-31 DIAGNOSIS — K760 Fatty (change of) liver, not elsewhere classified: Secondary | ICD-10-CM

## 2020-03-31 DIAGNOSIS — E785 Hyperlipidemia, unspecified: Secondary | ICD-10-CM | POA: Diagnosis not present

## 2020-03-31 DIAGNOSIS — F419 Anxiety disorder, unspecified: Secondary | ICD-10-CM

## 2020-03-31 DIAGNOSIS — H9193 Unspecified hearing loss, bilateral: Secondary | ICD-10-CM

## 2020-03-31 MED ORDER — SERTRALINE HCL 25 MG PO TABS: 25.0000 mg | ORAL_TABLET | Freq: Every day | ORAL | 3 refills | Status: DC

## 2020-03-31 NOTE — Assessment & Plan Note (Signed)
Improving readings with healthier diet changes, increased activity, weight loss.  The 10-year ASCVD risk score Denman George DC Montez Hageman., et al., 2013) is: 5.2%   Values used to calculate the score:     Age: 44 years     Sex: Male     Is Non-Hispanic African American: No     Diabetic: No     Tobacco smoker: Yes     Systolic Blood Pressure: 122 mmHg     Is BP treated: Yes     HDL Cholesterol: 36.2 mg/dL     Total Cholesterol: 168 mg/dL

## 2020-03-31 NOTE — Assessment & Plan Note (Signed)
Preventative protocols reviewed and updated unless pt declined. Discussed healthy diet and lifestyle.  

## 2020-03-31 NOTE — Assessment & Plan Note (Signed)
S/p US showing this, remote liver biopsy normal.

## 2020-03-31 NOTE — Progress Notes (Signed)
Patient ID: Antonio Ferguson, male    DOB: 10-11-1975, 44 y.o.   MRN: [id number]  This visit was conducted in person.  BP 122/74 (BP Location: Left Arm, Patient Position: Sitting, Cuff Size: Large)   Pulse 87   Temp 97.7 F (36.5 C) (Temporal)   Ht 5' 11.25" (1.81 m)   Wt 271 lb 9 oz (123.2 kg)   SpO2 97%   BMI 37.61 kg/m   138/88 on repeat   CC: CPE Subjective:   HPI: Antonio Ferguson is a 44 y.o. male presenting on 03/31/2020 for Annual Exam   See recent phone note/MyChart message for details.  Saw cardiology over the summer, coronary calcium score of 88.7, minimal non-obstructive CAD (0-24%).   Saw audiologist - new hearing aides. Chronic tinnitus.   [place] training yesterday, felt increased stress with this - manifesting as hot flashes, anxiety.  Last night had bad night with BP 160/110. BP this morning 126/80. Later today 150/80, 142/78.  Hypotension when on metoprolol XL 83m.   Anxiety - took hydroxyzine last night - overly sedating the next morning.  Overall sleeps well.  Has rotating schedule but does well with this.  ?ADD - met criteria, doesn't think strattera was beneficial. Did not have formal testing done.   OSA - continues using CPAP machine, unknown setting, DME [facility]. Requests sleep med referral.   L shoulder pain, R knee pain, lower back pain - attributes to work with [employer] units. Knee pain flares with prolonged walking.   Obesity - weight loss noted. Trying to eat healthier.   Preventative: Colon cancer screening - will start age 44  Flu shot at work Tdap 2010 - update today  COVID vaccine Moderna 07/2019 Seat belt use discussed Sunscreen use discussed. No changing moles on skin Non smoker Alcohol - a few beers/month Dentist q6 mo  Eye exam several years ago  Lives with wife and 2 children, 3 dogs  Occupation: pEngineer, [employer], side business on farm  Edu: Associate's degree  Activity: training dogs, running, enjoys weight lifting    Diet: good water, fruits/vegetables some      Relevant past medical, surgical, family and social history reviewed and updated as indicated. Interim medical history since our last visit reviewed. Allergies and medications reviewed and updated. Outpatient Medications Prior to Visit  Medication Sig Dispense Refill  . amLODipine (NORVASC) 5 MG tablet Take 1 tablet (5 mg total) by mouth daily. 30 tablet 3  . hydrOXYzine (ATARAX/VISTARIL) 25 MG tablet TAKE 0.5-1 TABLETS (12.5-25 MG TOTAL) BY MOUTH 2 (TWO) TIMES DAILY AS NEEDED FOR ANXIETY. 180 tablet 0  . levothyroxine (SYNTHROID) 50 MCG tablet TAKE 1 TABLET BY MOUTH EVERY DAY 90 tablet 0  . metoprolol succinate (TOPROL-XL) 25 MG 24 hr tablet Take 1 tablet (25 mg total) by mouth daily. Take with or immediately following a meal. 90 tablet 1  . nitroGLYCERIN (NITROSTAT) 0.4 MG SL tablet Place 1 tablet (0.4 mg total) under the tongue every 5 (five) minutes as needed for chest pain. 25 tablet 2  . omeprazole (PRILOSEC) 40 MG capsule Take 1 capsule (40 mg total) by mouth daily as needed. 90 capsule 0   No facility-administered medications prior to visit.     Per HPI unless specifically indicated in ROS section below Review of Systems  Constitutional: Negative for activity change, appetite change, chills, fatigue, fever and unexpected weight change.  HENT: Negative for hearing loss.   Eyes: Negative for visual disturbance.  Respiratory: Negative for cough, chest tightness, shortness of breath and wheezing.   Cardiovascular: Negative for chest pain, palpitations and leg swelling.  Gastrointestinal: Negative for abdominal distention, abdominal pain, blood in stool, constipation, diarrhea, nausea and vomiting.  Genitourinary: Negative for difficulty urinating and hematuria.  Musculoskeletal: Negative for arthralgias, myalgias and neck pain.  Skin: Negative for rash.  Neurological: Positive for dizziness (with elevated blood pressures). Negative for  seizures, syncope and headaches.  Hematological: Negative for adenopathy. Does not bruise/bleed easily.  Psychiatric/Behavioral: Negative for dysphoric mood. The patient is nervous/anxious.    Objective:  BP 122/74 (BP Location: Left Arm, Patient Position: Sitting, Cuff Size: Large)   Pulse 87   Temp 97.7 F (36.5 C) (Temporal)   Ht 5' 11.25" (1.81 m)   Wt 271 lb 9 oz (123.2 kg)   SpO2 97%   BMI 37.61 kg/m   Wt Readings from Last 3 Encounters:  03/31/20 271 lb 9 oz (123.2 kg)  12/02/19 (!) 278 lb 2 oz (126.2 kg)  10/22/19 282 lb 4 oz (128 kg)      Physical Exam Vitals and nursing note reviewed.  Constitutional:      General: He is not in acute distress.    Appearance: Normal appearance. He is well-developed. He is not ill-appearing.  HENT:     Head: Normocephalic and atraumatic.     Right Ear: Hearing, tympanic membrane, ear canal and external ear normal.     Left Ear: Hearing, tympanic membrane, ear canal and external ear normal.     Mouth/Throat:     Pharynx: Uvula midline.  Eyes:     General: No scleral icterus.    Extraocular Movements: Extraocular movements intact.     Conjunctiva/sclera: Conjunctivae normal.     Pupils: Pupils are equal, round, and reactive to light.  Cardiovascular:     Rate and Rhythm: Normal rate and regular rhythm.     Pulses: Normal pulses.          Radial pulses are 2+ on the right side and 2+ on the left side.     Heart sounds: Normal heart sounds. No murmur heard.   Pulmonary:     Effort: Pulmonary effort is normal. No respiratory distress.     Breath sounds: Normal breath sounds. No wheezing, rhonchi or rales.  Abdominal:     General: Abdomen is flat. Bowel sounds are normal. There is no distension.     Palpations: Abdomen is soft. There is no mass.     Tenderness: There is no abdominal tenderness. There is no guarding or rebound.     Hernia: No hernia is present.  Musculoskeletal:        General: Normal range of motion.     Cervical  back: Normal range of motion and neck supple.     Right lower leg: No edema.     Left lower leg: No edema.  Lymphadenopathy:     Cervical: No cervical adenopathy.  Skin:    General: Skin is warm and dry.     Findings: No rash.  Neurological:     General: No focal deficit present.     Mental Status: He is alert and oriented to person, place, and time.     Comments: CN grossly intact, station and gait intact  Psychiatric:        Mood and Affect: Mood normal.        Behavior: Behavior normal.        Thought Content: Thought content normal.  Judgment: Judgment normal.       Results for orders placed or performed in visit on 03/29/20  Ferritin  Result Value Ref Range   Ferritin 348.3 (H) 22.0 - 322.0 ng/mL  T4, free  Result Value Ref Range   Free T4 0.97 0.60 - 1.60 ng/dL  TSH  Result Value Ref Range   TSH 2.14 0.35 - 4.50 uIU/mL  Comprehensive metabolic panel  Result Value Ref Range   Sodium 138 135 - 145 mEq/L   Potassium 4.5 3.5 - 5.1 mEq/L   Chloride 102 96 - 112 mEq/L   CO2 30 19 - 32 mEq/L   Glucose, Bld 95 70 - 99 mg/dL   BUN 13 6 - 23 mg/dL   Creatinine, Ser 0.92 0.40 - 1.50 mg/dL   Total Bilirubin 0.7 0.2 - 1.2 mg/dL   Alkaline Phosphatase 94 39 - 117 U/L   AST 37 0 - 37 U/L   ALT 89 (H) 0 - 53 U/L   Total Protein 6.6 6.0 - 8.3 g/dL   Albumin 4.3 3.5 - 5.2 g/dL   GFR 102.93 >60.00 mL/min   Calcium 9.3 8.4 - 10.5 mg/dL  Lipid panel  Result Value Ref Range   Cholesterol 168 0 - 200 mg/dL   Triglycerides 223.0 (H) 0 - 149 mg/dL   HDL 36.20 (L) >39.00 mg/dL   VLDL 44.6 (H) 0.0 - 40.0 mg/dL   Total CHOL/HDL Ratio 5    NonHDL 131.76   LDL cholesterol, direct  Result Value Ref Range   Direct LDL 98.0 mg/dL   Depression screen [facility] 2/9 03/31/2020 06/25/2019 02/23/2019  Decreased Interest 0 0 1  Down, Depressed, Hopeless 0 0 1  PHQ - 2 Score 0 0 2  Altered sleeping 0 2 2  Tired, decreased energy 0 1 3  Change in appetite 0 0 1  Feeling bad or failure  about yourself  0 0 1  Trouble concentrating _0 Moving slowly or fidgety/restless 0 1 0  Suicidal thoughts 0 0 0  PHQ-9 Score _1 GAD 7 : Generalized Anxiety Score 03/31/2020 06/25/2019 02/23/2019  Nervous, Anxious, on Edge _2 Control/stop worrying _3 Worry too much - different things _4 Trouble relaxing _5 Restless _6 Easily annoyed or irritable _7 Afraid - awful might happen _8 Total GAD 7 Score _9 Assessment & Plan:  This visit occurred during the SARS-CoV-2 public health emergency.  Safety protocols were in place, including screening questions prior to the visit, additional usage of staff PPE, and extensive cleaning of exam room while observing appropriate contact time as indicated for disinfecting solutions.   Problem List Items Addressed This Visit    Transaminitis    Mild.       Subclinical hypothyroidism    Stable period on low dose levothyroxine - continue. Finds benefit in fatigue and energy levels.       Severe obesity (BMI 35.0-39.9) with comorbidity ([place])    Motivated for ongoing efforts towards sustainable weight loss. Congratulated on weight loss noted to date      OSA on CPAP    Long-term on CPAP - will refer to pulm ot re establish care.       Relevant Orders   Ambulatory referral to Pulmonology   Hearing loss    New hearing aides  Health maintenance examination - Primary    Preventative protocols reviewed and updated unless pt declined. Discussed healthy diet and lifestyle.       GERD (gastroesophageal reflux disease)    Manages with PPI intermittently. Discussed dosing.       Fatty liver    S/p US showing this, remote liver biopsy normal.       Essential hypertension    Recently elevated bp readings, likely stress/anxiety contributing. Today's office reading better.  We started amlodipine 2 wks ago, with some improvement but blood pressures continue elevated. Will continue amlodipine 64m,  start antidepressant, consider increase in metoprolol XL to 37.519mdaily. Previously 5044mose caused hypotension. Update with effect.  Will verify he is using correct size arm cuff for blood pressure monitoring at home.       Elevated ferritin    Tested negative for 2 common variants of HFA gene (HH). No fmhx HH. Will continue to monitor.       Dyslipidemia    Improving readings with healthier diet changes, increased activity, weight loss.  The 10-year ASCVD risk score (GoMikey Bussing Jr.Brooke Bonitoet al., 2013) is: 5.2%   Values used to calculate the score:     Age: [age]     Sex: Male     Is Non-Hispanic African American: No     Diabetic: No     Tobacco smoker: Yes     Systolic Blood Pressure: 122191Hg     Is BP treated: Yes     HDL Cholesterol: 36.2 mg/dL     Total Cholesterol: 168 mg/dL       Anxiety    Worsening anxiety noted, work and health related, anticipate contributing to hypertension. High GAD7 score.  Ongoing, daily, will benefit from daily medication to help with anxiety - will start SSRI, discussing common side effects including GI upset/HA as well as monitoring for suicidality.  Will start sertraline 65m105mily with option to increase dose as needed, update over mychart with effect. Hydroxyzine beneficial, however overly sedating.  Not interested in counseling at this time.       Relevant Medications   sertraline (ZOLOFT) 25 MG tablet    Other Visit Diagnoses    Need for Tdap vaccination       Relevant Orders   Tdap vaccine greater than or equal to 7yo IM (Completed)       Meds ordered this encounter  Medications  . sertraline (ZOLOFT) 25 MG tablet    Sig: Take 1 tablet (25 mg total) by mouth daily.    Dispense:  30 tablet    Refill:  3   Orders Placed This Encounter  Procedures  . Tdap vaccine greater than or equal to 7yo IM  . Ambulatory referral to Pulmonology    Referral Priority:   Routine    Referral Type:   Consultation    Referral Reason:   Specialty  Services Required    Requested Specialty:   Pulmonary Disease    Number of Visits Requested:   1    Patient instructions: Check at work on immunizations and update me.  Tdap today.  Send us [place] dates through MyChCenter[facility] amlodipine dose to 5mg 48mly.  Get new XL adult cuff to monitor blood pressures at home.  If needed, increase metoprolol XL to 1.5 tablets (37.5mg) 68mly.  Let me know how blood pressures are running.  For ongoing anxiety, let's try sertraline 65mg d64m.   Follow up plan: Return in about  2 months (around 05/31/2020) for follow up visit.  Ria Bush, MD

## 2020-03-31 NOTE — Patient Instructions (Addendum)
Check at work on immunizations and update me.  Tdap today.  Send [place] Moderna dates through MyChart.  Drop amlodipine dose to 5mg  daily.  Get new XL adult cuff to monitor blood pressures at home.  If needed, increase metoprolol XL to 1.5 tablets (37.5mg ) daily.  Let me know how blood pressures are running.  For ongoing anxiety, let's try sertraline 25mg  daily.   Health Maintenance, Male Adopting a healthy lifestyle and getting preventive care are important in promoting health and wellness. Ask your health care provider about:  The right schedule for you to have regular tests and exams.  Things you can do on your own to prevent diseases and keep yourself healthy. What should I know about diet, weight, and exercise? Eat a healthy diet   Eat a diet that includes plenty of vegetables, fruits, low-fat dairy products, and lean protein.  Do not eat a lot of foods that are high in solid fats, added sugars, or sodium. Maintain a healthy weight Body mass index (BMI) is a measurement that can be used to identify possible weight problems. It estimates body fat based on height and weight. Your health care provider can help determine your BMI and help you achieve or maintain a healthy weight. Get regular exercise Get regular exercise. This is one of the most important things you can do for your health. Most adults should:  Exercise for at least 150 minutes each week. The exercise should increase your heart rate and make you sweat (moderate-intensity exercise).  Do strengthening exercises at least twice a week. This is in addition to the moderate-intensity exercise.  Spend less time sitting. Even light physical activity can be beneficial. Watch cholesterol and blood lipids Have your blood tested for lipids and cholesterol at 42 years of age, then have this test every 5 years. You may need to have your cholesterol levels checked more often if:  Your lipid or cholesterol levels are high.  You are  older than 42 years of age.  You are at high risk for heart disease. What should I know about cancer screening? Many types of cancers can be detected early and may often be prevented. Depending on your health history and family history, you may need to have cancer screening at various ages. This may include screening for:  Colorectal cancer.  Prostate cancer.  Skin cancer.  Lung cancer. What should I know about heart disease, diabetes, and high blood pressure? Blood pressure and heart disease  High blood pressure causes heart disease and increases the risk of stroke. This is more likely to develop in people who have high blood pressure readings, are of African descent, or are overweight.  Talk with your health care provider about your target blood pressure readings.  Have your blood pressure checked: ? Every 3-5 years if you are 67-25 years of age. ? Every year if you are 71 years old or older.  If you are between the ages of 9 and 30 and are a current or former smoker, ask your health care provider if you should have a one-time screening for abdominal aortic aneurysm (AAA). Diabetes Have regular diabetes screenings. This checks your fasting blood sugar level. Have the screening done:  Once every three years after age 71 if you are at a normal weight and have a low risk for diabetes.  More often and at a younger age if you are overweight or have a high risk for diabetes. What should I know about preventing infection? Hepatitis B  If you have a higher risk for hepatitis B, you should be screened for this virus. Talk with your health care provider to find out if you are at risk for hepatitis B infection. Hepatitis C Blood testing is recommended for:  Everyone born from [birth year] through 1963.  Anyone with known risk factors for hepatitis C. Sexually transmitted infections (STIs)  You should be screened each year for STIs, including gonorrhea and chlamydia, if: ? You are sexually  active and are younger than 42 years of age. ? You are older than 42 years of age and your health care provider tells you that you are at risk for this type of infection. ? Your sexual activity has changed since you were last screened, and you are at increased risk for chlamydia or gonorrhea. Ask your health care provider if you are at risk.  Ask your health care provider about whether you are at high risk for HIV. Your health care provider may recommend a prescription medicine to help prevent HIV infection. If you choose to take medicine to prevent HIV, you should first get tested for HIV. You should then be tested every 3 months for as long as you are taking the medicine. Follow these instructions at home: Lifestyle  Do not use any products that contain nicotine or tobacco, such as cigarettes, e-cigarettes, and chewing tobacco. If you need help quitting, ask your health care provider.  Do not use street drugs.  Do not share needles.  Ask your health care provider for help if you need support or information about quitting drugs. Alcohol use  Do not drink alcohol if your health care provider tells you not to drink.  If you drink alcohol: ? Limit how much you have to 0-2 drinks a day. ? Be aware of how much alcohol is in your drink. In the [place] one drink equals one 12 oz bottle of beer (355 mL), one 5 oz glass of wine (148 mL), or one 1 oz glass of hard liquor (44 mL). General instructions  Schedule regular health, dental, and eye exams.  Stay current with your vaccines.  Tell your health care provider if: ? You often feel depressed. ? You have ever been abused or do not feel safe at home. Summary  Adopting a healthy lifestyle and getting preventive care are important in promoting health and wellness.  Follow your health care provider's instructions about healthy diet, exercising, and getting tested or screened for diseases.  Follow your health care provider's instructions on  monitoring your cholesterol and blood pressure. This information is not intended to replace advice given to you by your health care provider. Make sure you discuss any questions you have with your health care provider. Document Revised: 04/22/2018 Document Reviewed: 04/22/2018 Elsevier Patient Education  2020 [employer].

## 2020-03-31 NOTE — Assessment & Plan Note (Signed)
Stable period on low dose levothyroxine - continue. Finds benefit in fatigue and energy levels.

## 2020-03-31 NOTE — Assessment & Plan Note (Signed)
Manages with PPI intermittently. Discussed dosing.

## 2020-04-02 DIAGNOSIS — Z9989 Dependence on other enabling machines and devices: Secondary | ICD-10-CM | POA: Insufficient documentation

## 2020-04-02 DIAGNOSIS — H9190 Unspecified hearing loss, unspecified ear: Secondary | ICD-10-CM | POA: Insufficient documentation

## 2020-04-02 DIAGNOSIS — G4733 Obstructive sleep apnea (adult) (pediatric): Secondary | ICD-10-CM | POA: Insufficient documentation

## 2020-04-02 DIAGNOSIS — H903 Sensorineural hearing loss, bilateral: Secondary | ICD-10-CM | POA: Insufficient documentation

## 2020-04-02 NOTE — Assessment & Plan Note (Addendum)
Worsening anxiety noted, work and health related, anticipate contributing to hypertension. High GAD7 score.  Ongoing, daily, will benefit from daily medication to help with anxiety - will start SSRI, discussing common side effects including GI upset/HA as well as monitoring for suicidality.  Will start sertraline 25mg  daily with option to increase dose as needed, update over mychart with effect. Hydroxyzine beneficial, however overly sedating.  Not interested in counseling at this time.

## 2020-04-02 NOTE — Assessment & Plan Note (Addendum)
Recently elevated bp readings, likely stress/anxiety contributing. Today's office reading better.  We started amlodipine 2 wks ago, with some improvement but blood pressures continue elevated. Will continue amlodipine 5mg , start antidepressant, consider increase in metoprolol XL to 37.5mg  daily. Previously 50mg  dose caused hypotension. Update with effect.  Will verify he is using correct size arm cuff for blood pressure monitoring at home.

## 2020-04-02 NOTE — Assessment & Plan Note (Signed)
New hearing aides 

## 2020-04-02 NOTE — Assessment & Plan Note (Addendum)
Motivated for ongoing efforts towards sustainable weight loss. Congratulated on weight loss noted to date

## 2020-04-02 NOTE — Assessment & Plan Note (Signed)
Tested negative for 2 common variants of HFA gene (HH). No fmhx HH. Will continue to monitor.

## 2020-04-02 NOTE — Assessment & Plan Note (Signed)
Long-term on CPAP - will refer to pulm ot re establish care.

## 2020-04-02 NOTE — Assessment & Plan Note (Signed)
Mild.

## 2020-04-23 ENCOUNTER — Other Ambulatory Visit: Payer: Self-pay | Admitting: Family Medicine

## 2020-04-24 NOTE — Telephone Encounter (Signed)
90-day rx request from pharmacy.  Ok to send in? 

## 2020-04-25 NOTE — Telephone Encounter (Signed)
ERx 

## 2020-05-09 ENCOUNTER — Telehealth: Payer: Self-pay

## 2020-05-09 MED ORDER — METOPROLOL SUCCINATE ER 25 MG PO TB24: 37.5000 mg | ORAL_TABLET | Freq: Every day | ORAL | 1 refills | Status: DC

## 2020-05-09 NOTE — Telephone Encounter (Signed)
Pt's wife Efraim Kaufmann called requesting refill of Metoprolol 25mg , taking 1 1/2 tablet 37.5mg   I am not sure if you need to call pt to get update on how he is doing since change in dose at last OV

## 2020-05-09 NOTE — Telephone Encounter (Signed)
Spoke with pt asking about recent BP readings.  States he has not checked BP in a couple of wks.  However, after most recent changes in meds, pt no longer has headaches.  Pt states he bought a new monitor with a larger cuff and will update Dr. Reece Agar with BP readings.

## 2020-05-09 NOTE — Telephone Encounter (Signed)
Refilled metoprolol XL at 1.5 tablets daily (37.5mg ) plz get update on recent BP readings.

## 2020-05-14 ENCOUNTER — Other Ambulatory Visit: Payer: Self-pay | Admitting: Family Medicine

## 2020-05-15 ENCOUNTER — Institutional Professional Consult (permissible substitution): Payer: Managed Care, Other (non HMO) | Admitting: Pulmonary Disease

## 2020-05-17 ENCOUNTER — Encounter: Payer: Self-pay | Admitting: Family Medicine

## 2020-05-17 ENCOUNTER — Telehealth (INDEPENDENT_AMBULATORY_CARE_PROVIDER_SITE_OTHER): Payer: Managed Care, Other (non HMO) | Admitting: Family Medicine

## 2020-05-17 DIAGNOSIS — R0981 Nasal congestion: Secondary | ICD-10-CM | POA: Insufficient documentation

## 2020-05-17 NOTE — Progress Notes (Signed)
No show for visit      

## 2020-05-26 ENCOUNTER — Telehealth: Payer: Self-pay

## 2020-05-26 NOTE — Telephone Encounter (Signed)
Lm for patient to request that he bring sleep study and SD card to 05/29/2020 visit.

## 2020-05-29 ENCOUNTER — Institutional Professional Consult (permissible substitution): Payer: Managed Care, Other (non HMO) | Admitting: Pulmonary Disease

## 2020-05-30 NOTE — Telephone Encounter (Signed)
OV was canceled due to inclement weather.  Will close encounter.

## 2020-06-12 ENCOUNTER — Other Ambulatory Visit: Payer: Self-pay | Admitting: Family Medicine

## 2020-06-13 NOTE — Telephone Encounter (Signed)
Pharmacy requests refill on: Amlodipine 5 mg   LAST REFILL: 03/20/2020 (Q-30, R-3) LAST OV: 03/31/2020 NEXT OV: 06/19/2020 PHARMACY: [facility] [phone] in [facility] [place], [place]  Pharmacy requests refill on: Levothyroxine 50 mcg   LAST REFILL: 03/20/2020 (Q-90, R-0) LAST OV: 03/31/2020 NEXT OV: 06/19/2020 PHARMACY: [facility] [phone] in [facility] [place] [place]  TSH (03/29/2020)

## 2020-06-15 ENCOUNTER — Other Ambulatory Visit: Payer: Self-pay | Admitting: Family Medicine

## 2020-06-19 ENCOUNTER — Ambulatory Visit: Payer: Managed Care, Other (non HMO) | Admitting: Family Medicine

## 2020-06-29 ENCOUNTER — Ambulatory Visit: Payer: Managed Care, Other (non HMO) | Admitting: Cardiology

## 2020-07-01 IMAGING — MR MR ELBOW*L* W/O CM
4 of 5 series · 25 of 40 positions shown · non-contrast
Comparison: None.

CLINICAL DATA: Left elbow pain.  Pain when lifting arm or twisting.

EXAM:
MRI OF THE LEFT ELBOW WITHOUT CONTRAST
TECHNIQUE: Multiplanar, multisequence MR imaging of the elbow was performed. No
intravenous contrast was administered.

[Series 4: T1 · axial · 3.0mm · 0.23mm/px · z∈[-4,+82]mm · 3 of 32 slices shown]
[im 5/32]
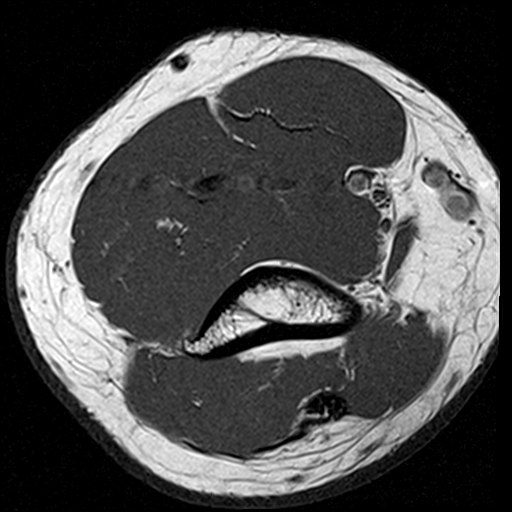
[im 18/32]
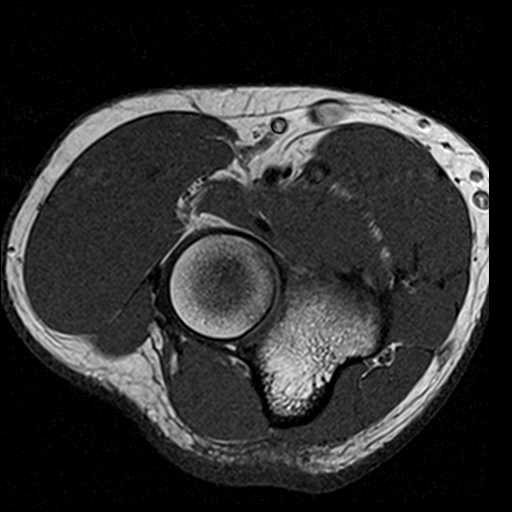
[im 27/32]
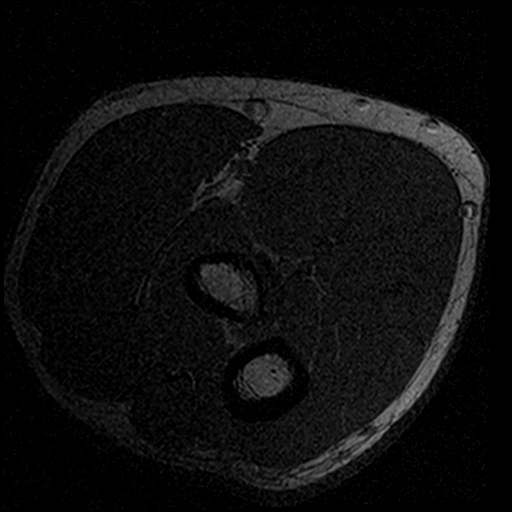

[Series 5: T2 fat-sat · axial · 3.0mm · 0.23mm/px · z∈[-20,+102]mm · 8 of 32 slices shown (1 of 2)]
[im 1/32]
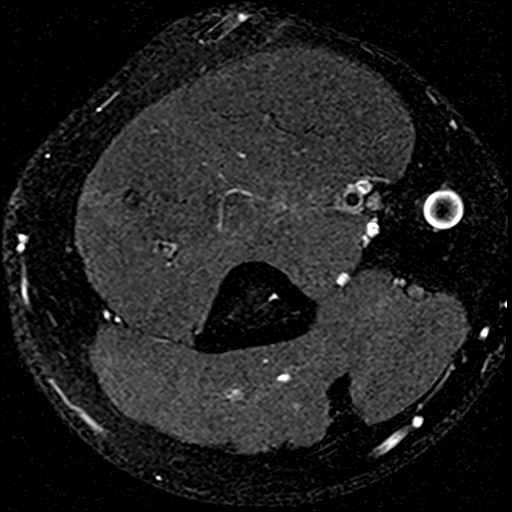
[im 5/32]
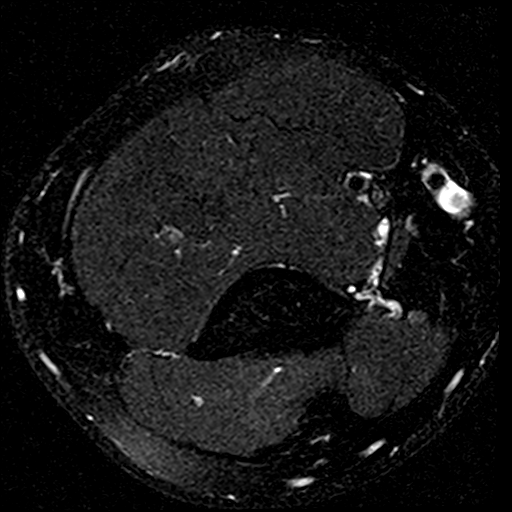
[im 9/32]
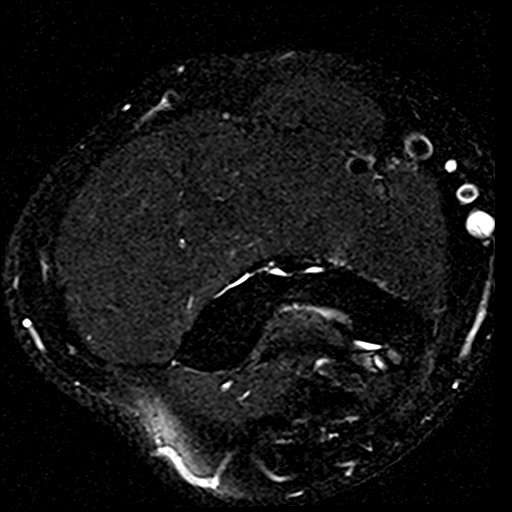
[im 14/32]
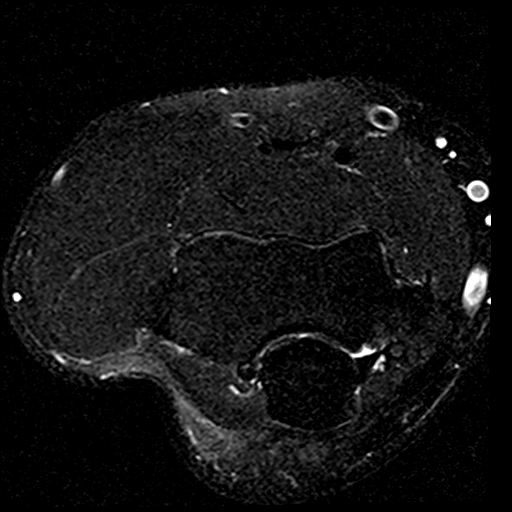
[im 18/32]
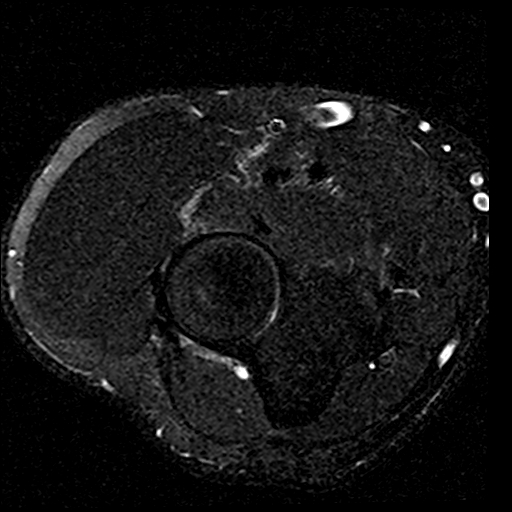
[im 23/32]
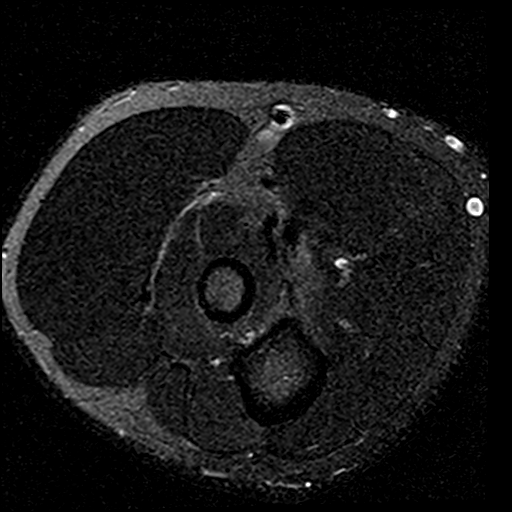
[im 27/32]
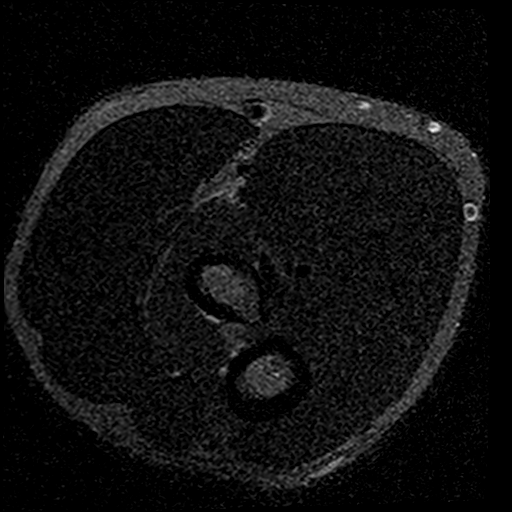
[im 32/32]
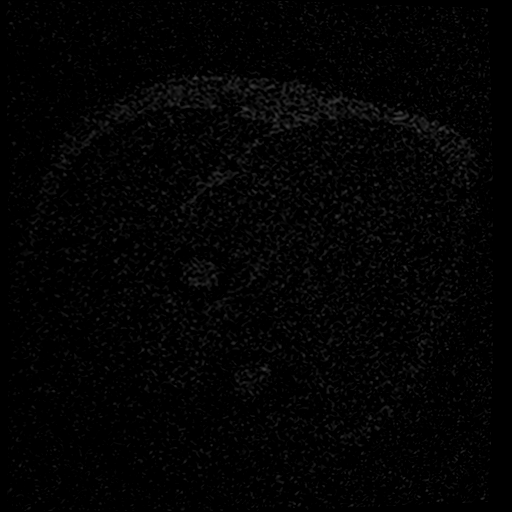

[Series 10: PD · sagittal · 3.0mm · 0.55mm/px · 8 of 29 slices shown]
[im 1/29]
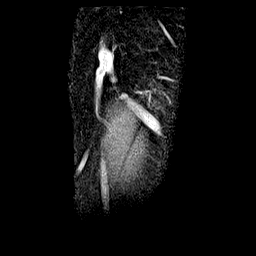
[im 5/29]
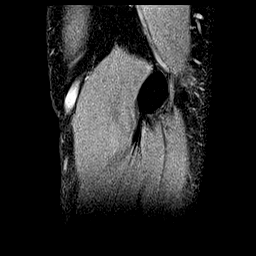
[im 9/29]
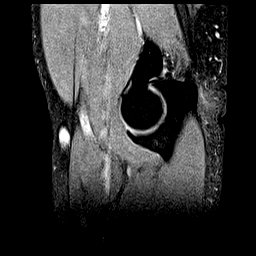
[im 13/29]
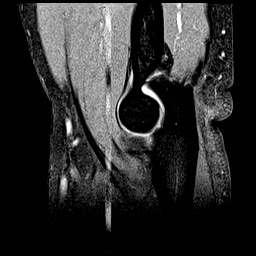
[im 17/29]
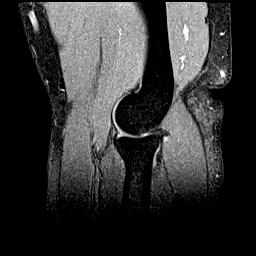
[im 21/29]
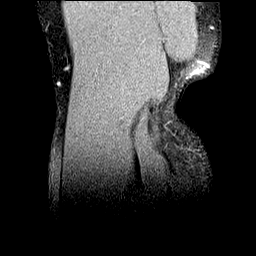
[im 25/29]
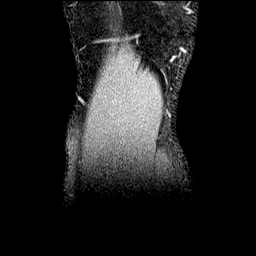
[im 29/29]
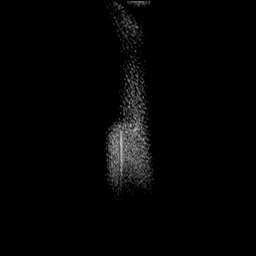

[Series 12: T2 fat-sat · coronal · 3.0mm · 0.27mm/px · 6 of 30 slices shown (2 of 2)]
[im 1/30]
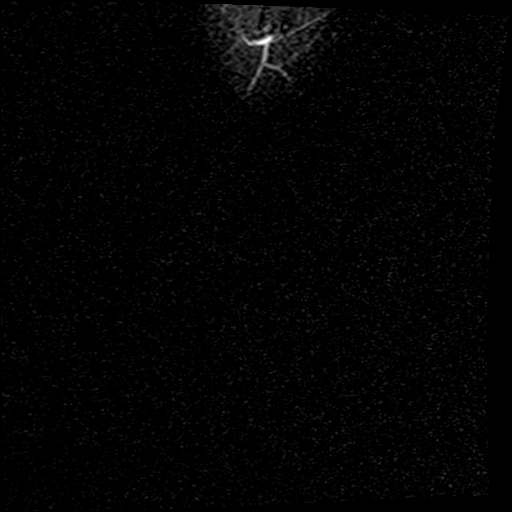
[im 5/30]
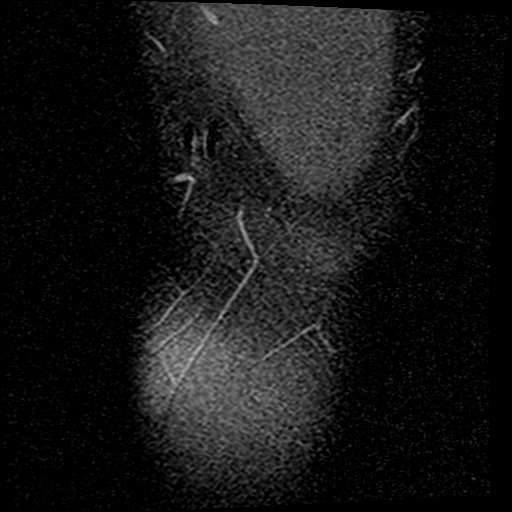
[im 9/30]
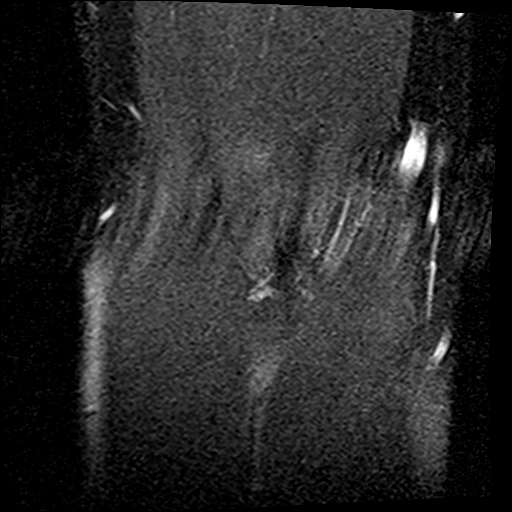
[im 13/30]
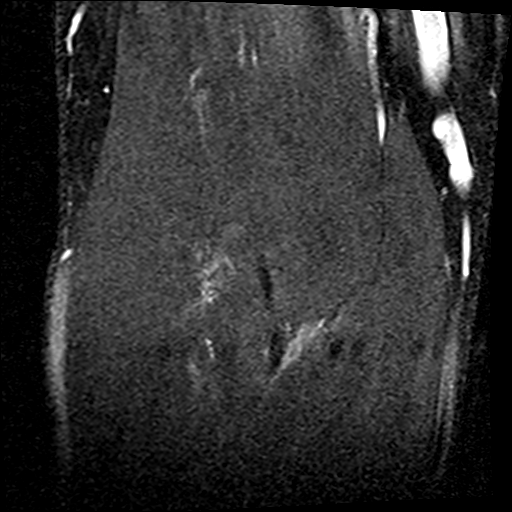
[im 17/30]
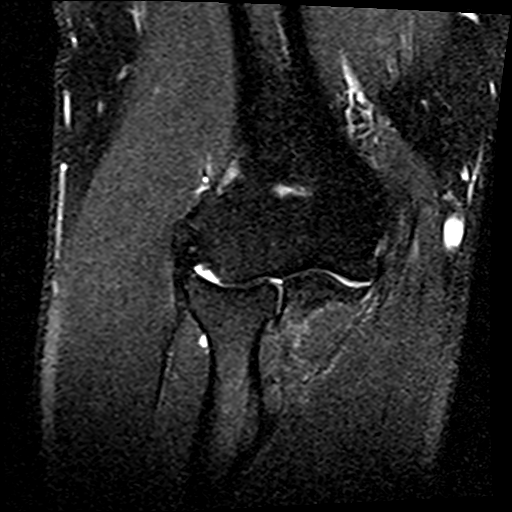
[im 25/30]
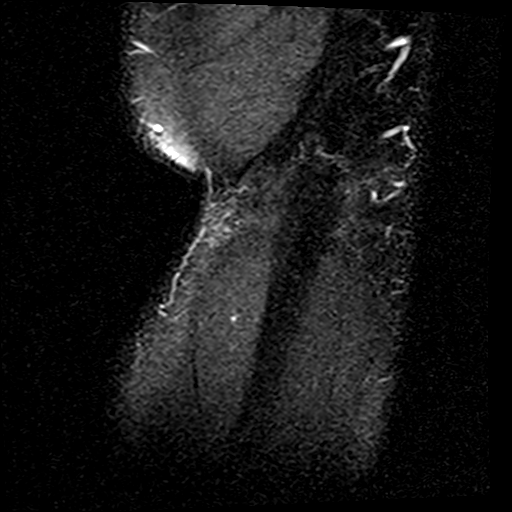

[25 of 40 positions shown; findings below may reference images not displayed]

FINDINGS: TENDONS

Common forearm flexor origin: Mild tendinosis of the common flexor
tendon origin.

Common forearm extensor origin: Intact.

Biceps: Mild tendinosis of the distal biceps tendon proximal to its
radial insertion. No discrete tear.

Triceps: Intact.

LIGAMENTS

Medial stabilizers: Intact.

Lateral stabilizers:  Intact.

Cartilage: No chondral defect.

Joint: No joint effusion. No intra-articular loose body. No
synovitis.

Cubital tunnel: Normal.

Bones: No acute osseous abnormality.  No aggressive osseous lesion.

Soft tissue: Muscles are normal.  No muscle atrophy.
IMPRESSION: 1. Mild tendinosis of the common flexor tendon origin.
2. Mild tendinosis of the distal biceps tendon proximal to its
radial insertion.

## 2020-07-07 ENCOUNTER — Ambulatory Visit: Payer: Managed Care, Other (non HMO) | Admitting: Family Medicine

## 2020-07-07 ENCOUNTER — Other Ambulatory Visit: Payer: Self-pay

## 2020-07-07 ENCOUNTER — Ambulatory Visit: Payer: Managed Care, Other (non HMO) | Admitting: Adult Health

## 2020-07-07 ENCOUNTER — Encounter: Payer: Self-pay | Admitting: Adult Health

## 2020-07-07 ENCOUNTER — Other Ambulatory Visit: Payer: Self-pay | Admitting: Family Medicine

## 2020-07-07 VITALS — BP 138/78 | HR 77 | Ht 71.0 in | Wt 284.0 lb

## 2020-07-07 DIAGNOSIS — Z9989 Dependence on other enabling machines and devices: Secondary | ICD-10-CM

## 2020-07-07 DIAGNOSIS — G4733 Obstructive sleep apnea (adult) (pediatric): Secondary | ICD-10-CM

## 2020-07-07 NOTE — Progress Notes (Signed)
Reviewed and agree with assessment/plan.   Bassheva Flury, MD [facility] 07/07/2020, 12:42 PM Pager:  [phone]  

## 2020-07-07 NOTE — Assessment & Plan Note (Signed)
Patient education on sleep apnea. Went over CPAP treatment options including weight loss, oral appliance, CPAP. Patient has underlying mild sleep apnea. Would like to continue on CPAP. Will adjust pressure for comfort change to 5 to 10 cm H2O. Change mask to DreamWear full facemask for comfort.  Plan  Patient Instructions  Try to wear cpap everynight for at least 6 hours or more We will change cpap pressure to 5-10 cpap download in 4 weeks Do not drive if sleepy Work on healthy weight May switch to dreamware face mask  Follow up in 4 months  and As needed

## 2020-07-07 NOTE — Assessment & Plan Note (Signed)
Healthy weight loss discussed 

## 2020-07-07 NOTE — Patient Instructions (Addendum)
Try to wear cpap everynight for at least 6 hours or more We will change cpap pressure to 5-10 cpap download in 4 weeks Do not drive if sleepy Work on healthy weight May switch to dreamware face mask  Follow up in 4 months  and As needed

## 2020-07-07 NOTE — Progress Notes (Signed)
@Patient  ID: , male    DOB: 1975-12-24, 45 y.o.   MRN: [id number]  Chief Complaint  Patient presents with  . sleep consult    Referring provider: [id number], MD  HPI: 45 year old male with mild obstructive sleep apnea presents for new sleep consult to establish for sleep apnea Patient was diagnosed with sleep apnea in 2015.  He was followed at [facility] pulmonology.  Care everywhere notes revealed patient had a home sleep study in 2015 that revealed an AHI at 8.5/hour.  Patient was started on auto CPAP 5 to 15 cm H2O in October 2015. Medical history significant for hypertension and hyperlipidemia  TEST/EVENTS :  Home sleep study 2015 AHI 8.5  07/07/2020 Sleep consult : OSA  Patient presents for a sleep consult to establish for sleep apnea.  Patient was diagnosed with sleep apnea in 2015 after presenting with  snoring and daytime sleepiness.  He was set up for home sleep study at [facility] pulmonology.  Home sleep study and 2015 showed mild sleep apnea with AHI at 8.5/hour.  Patient was started on auto CPAP 5 to 15 cm H2O. Says he tries to wear each night . Has tried several mask, currently using full face mask. Does not like nasal mask.   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 Schedule is very busy and demanding. Typically takes 15 to 20 minutes to go to sleep. Gets shorter sleep intervals when working 2nd and 3 rd shift. Says he feels good but main issue is snoring at nightime is keeping spouse awake. Wants to fix this so does not cause restless sleep and stress for spouse. .  Weight has been fluctuating over the last couple years ranging anywhere from 260 to 280 pounds. Very active, works hard. No sleepiness at work , driving or when active.   Medical history significant for high blood pressure and hyperlipidemia, hypothyroidism.   CPAP download shows 67% compliance with daily average usage at 4.5 hours.  Patient is on auto CPAP 5 to 15  cm H2O.  AHI is 1.3.  Positive mask leaks. Avg pressure is 7cm.   Watches TV at bedtime. 2 cups of caffeine daily .   Denies teeth grinding . Sleep walking , cataplexy or narcolepsy symptoms .      Allergies  Allergen Reactions  . Oxycodone-Acetaminophen Nausea Only  . Latex Other (See Comments)    BURNING OF FACE IF LATEX TOUCHES FACE    Immunization History  Administered Date(s) Administered  . Influenza,inj,Quad PF,6+ Mos 03/03/2018, 02/23/2019  . Influenza-Unspecified 03/24/2017  . Tdap 04/11/2009, 03/31/2020    Past Medical History:  Diagnosis Date  . Acid reflux   . GERD (gastroesophageal reflux disease)    rare  . HLD (hyperlipidemia)   . Hypertension   . Hypothyroid   . NAFLD (nonalcoholic fatty liver disease) 04/02/2020   s/p 9758 and liver biopsy  . OSA on CPAP 02/2014   Dr 03/2014 [facility]   . 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 Types: Cigars  Smokeless Tobacco Never Used  Tobacco Comment   3-4  per month   Ready to quit: No Counseling given: Yes Comment: 3-4  per month  Patient is married.  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  Work shift work.  Has 2 children age 47 and 41.  Smokes cigars about 3-4 a month.  Minimum alcohol use.  Family history is significant for heart disease and cancer.  Surgical history is significant for a thyroglossal duct cyst removal.   Outpatient Medications Prior to Visit  Medication Sig Dispense Refill  . amLODipine (NORVASC) 5 MG tablet TAKE 1 TABLET BY MOUTH EVERY DAY 30 tablet 0  . hydrOXYzine (ATARAX/VISTARIL) 25 MG tablet TAKE 0.5-1 TABLETS (12.5-25 MG TOTAL) BY MOUTH 2 (TWO) TIMES DAILY AS NEEDED FOR ANXIETY. 180 tablet 0  . levothyroxine (SYNTHROID) 50 MCG tablet TAKE 1 TABLET BY MOUTH EVERY DAY 90 tablet 0  . metoprolol succinate (TOPROL-XL) 25 MG 24 hr tablet Take 1.5 tablets (37.5 mg total) by mouth daily. Take  with or immediately following a meal. 130 tablet 1  . nitroGLYCERIN (NITROSTAT) 0.4 MG SL tablet Place 1 tablet (0.4 mg total) under the tongue every 5 (five) minutes as needed for chest pain. 25 tablet 2  . omeprazole (PRILOSEC) 40 MG capsule TAKE 1 CAPSULE (40 MG TOTAL) BY MOUTH DAILY AS NEEDED. 90 capsule 1  . sertraline (ZOLOFT) 25 MG tablet TAKE 1 TABLET BY MOUTH EVERY DAY 90 tablet 3   No facility-administered medications prior to visit.     Review of Systems:   Constitutional:   No  weight loss, night sweats,  Fevers, chills,  fatigue, or  lassitude.  HEENT:   No headaches,  Difficulty swallowing,  Tooth/dental problems, or  Sore throat,                No sneezing, itching, ear ache, nasal congestion, post nasal drip,   CV:  No chest pain,  Orthopnea, PND, swelling in lower extremities, anasarca, dizziness, palpitations, syncope.   GI  No heartburn, indigestion, abdominal pain, nausea, vomiting, diarrhea, change in bowel habits, loss of appetite, bloody stools.   Resp: No shortness of breath with exertion or at rest.  No excess mucus, no productive cough,  No non-productive cough,  No coughing up of blood.  No change in color of mucus.  No wheezing.  No chest wall deformity  Skin: no rash or lesions.  GU: no dysuria, change in color of urine, no urgency or frequency.  No flank pain, no hematuria   MS:  No joint pain or swelling.  No decreased range of motion.  No back pain.    Physical Exam  BP 138/78 (BP Location: Left Arm, Cuff Size: Normal)   Pulse 77   Ht 5\' 11"  (1.803 m)   Wt 284 lb (128.8 kg)   SpO2 98%   BMI 39.61 kg/m   GEN: A/Ox3; pleasant , NAD, well nourished BMI 39    HEENT:  [place]/AT,   NOSE-clear, THROAT-clear, no lesions, no postnasal drip or exudate noted. Class 3 MP airway . Elongated uvula , full beard  NECK:  Supple w/ fair ROM; no JVD; normal carotid impulses w/o bruits; no thyromegaly or nodules palpated; no lymphadenopathy.    RESP  Clear  P &  A; w/o, wheezes/ rales/ or rhonchi. no accessory muscle use, no dullness to percussion  CARD:  RRR, no m/r/g, no peripheral edema, pulses intact, no cyanosis or clubbing.  GI:   Soft & nt; nml bowel sounds; no organomegaly or masses detected.   Musco: Warm bil, no deformities or joint swelling noted.   Neuro: alert, no focal deficits noted.    Skin: Warm, no lesions or rashes    Lab Results:  BNP No results found for: BNP  ProBNP  No results found for: PROBNP  Imaging: No results found.    No flowsheet data found.  No results found for: NITRICOXIDE      Assessment & Plan:   OSA on CPAP Patient education on sleep apnea. Went over CPAP treatment options including weight loss, oral appliance, CPAP. Patient has underlying mild sleep apnea. Would like to continue on CPAP. Will adjust pressure for comfort change to 5 to 10 cm H2O. Change mask to DreamWear full facemask for comfort.  Plan  Patient Instructions  Try to wear cpap everynight for at least 6 hours or more We will change cpap pressure to 5-10 cpap download in 4 weeks Do not drive if sleepy Work on healthy weight May switch to dreamware face mask  Follow up in 4 months  and As needed       Severe obesity (BMI 35.0-39.9) with comorbidity (HCC) Healthy weight loss discussed     Rubye Oaks, NP 07/07/2020

## 2020-07-17 ENCOUNTER — Telehealth: Payer: Self-pay | Admitting: Family Medicine

## 2020-07-17 NOTE — Telephone Encounter (Signed)
Medication Refill - Medication:  amLODipine (NORVASC) 5 MG tablet  Has the patient contacted their pharmacy? No. Patient has an appointment 3/16. Patient is out of medication. Please advise.   Preferred Pharmacy (with phone number or street name):  [id number] [place], [place] - [address] Phone:  [phone]  Fax:  [phone]

## 2020-07-17 NOTE — Telephone Encounter (Signed)
Left message on vm per dpr notifying pt s refill was [facility] to [facility] on  07/07/20, #30 with 3 refills.

## 2020-07-19 NOTE — Telephone Encounter (Signed)
Left message on vm per dpr notifying pt s refill [facility] to [facility] on  07/07/20, #30 with 3 refills.  

## 2020-07-26 ENCOUNTER — Other Ambulatory Visit: Payer: Self-pay

## 2020-07-26 ENCOUNTER — Ambulatory Visit: Payer: Managed Care, Other (non HMO) | Admitting: Family Medicine

## 2020-07-26 ENCOUNTER — Encounter: Payer: Self-pay | Admitting: Family Medicine

## 2020-07-26 VITALS — BP 126/76 | HR 64 | Temp 97.8°F | Ht 71.0 in | Wt 283.5 lb

## 2020-07-26 DIAGNOSIS — Z9989 Dependence on other enabling machines and devices: Secondary | ICD-10-CM

## 2020-07-26 DIAGNOSIS — G4733 Obstructive sleep apnea (adult) (pediatric): Secondary | ICD-10-CM | POA: Diagnosis not present

## 2020-07-26 DIAGNOSIS — I10 Essential (primary) hypertension: Secondary | ICD-10-CM | POA: Diagnosis not present

## 2020-07-26 DIAGNOSIS — F419 Anxiety disorder, unspecified: Secondary | ICD-10-CM | POA: Diagnosis not present

## 2020-07-26 DIAGNOSIS — H9193 Unspecified hearing loss, bilateral: Secondary | ICD-10-CM

## 2020-07-26 DIAGNOSIS — E038 Other specified hypothyroidism: Secondary | ICD-10-CM

## 2020-07-26 NOTE — Patient Instructions (Signed)
I'm glad you're doing well today.  Continue current regimen.  Return for physical in November, sooner if needed.

## 2020-07-26 NOTE — Progress Notes (Signed)
Patient ID: Antonio Ferguson, male    DOB: 20-Feb-1976, 45 y.o.   MRN: [id number]  This visit was conducted in person.  BP 126/76   Pulse 64   Temp 97.8 F (36.6 C) (Temporal)   Ht 5\' 11"  (1.803 m)   Wt 283 lb 8 oz (128.6 kg)   SpO2 98%   BMI 39.54 kg/m    CC: 2 mo f/u visit  Subjective:   HPI: Antonio Ferguson is a 45 y.o. male presenting on 07/26/2020 for Follow-up (Here for 2 mo HTN and anxiety f/u.)   See prior notes for details.   Likely had COVID 05/13/2020, fever and loss of taste/smell, but didn't get tested.   HTN - Compliant with current antihypertensive regimen of amlodipine 5mg  daily, toprol XL 37.5mg  daily. Does check blood pressures at home: great control at home. No low blood pressure readings or symptoms of dizziness/syncope. Denies HA, vision changes, CP/tightness, SOB, leg swelling.    Anxiety - stable period on sertraline 25mg  daily. Feels this has significantly helped. Hydroxyzine overly sedating. Desires to avoid PRN benzo.  ?ADD - didn't feel strattera beneficial. Did not have formal testing done   Mild OSA continues CPAP recently saw pulm with change in pressure settings 5-10 cmH2O.   Hypothyroidism - stable period on low dose levothyroxine 07/11/2020. Feels well with this.  Lab Results  Component Value Date   TSH 2.14 03/29/2020    Family notes he's occasionally breathing heavier. He doesn't feel short winded.      Relevant past medical, surgical, family and social history reviewed and updated as indicated. Interim medical history since our last visit reviewed. Allergies and medications reviewed and updated. Outpatient Medications Prior to Visit  Medication Sig Dispense Refill  . amLODipine (NORVASC) 5 MG tablet TAKE 1 TABLET BY MOUTH EVERY DAY 30 tablet 3  . hydrOXYzine (ATARAX/VISTARIL) 25 MG tablet TAKE 0.5-1 TABLETS (12.5-25 MG TOTAL) BY MOUTH 2 (TWO) TIMES DAILY AS NEEDED FOR ANXIETY. 180 tablet 0  . levothyroxine (SYNTHROID) 50 MCG tablet TAKE 1  TABLET BY MOUTH EVERY DAY 90 tablet 0  . metoprolol succinate (TOPROL-XL) 25 MG 24 hr tablet Take 1.5 tablets (37.5 mg total) by mouth daily. Take with or immediately following a meal. 130 tablet 1  . nitroGLYCERIN (NITROSTAT) 0.4 MG SL tablet Place 1 tablet (0.4 mg total) under the tongue every 5 (five) minutes as needed for chest pain. 25 tablet 2  . omeprazole (PRILOSEC) 40 MG capsule TAKE 1 CAPSULE (40 MG TOTAL) BY MOUTH DAILY AS NEEDED. 90 capsule 1  . sertraline (ZOLOFT) 25 MG tablet TAKE 1 TABLET BY MOUTH EVERY DAY 90 tablet 3   No facility-administered medications prior to visit.     Per HPI unless specifically indicated in ROS section below Review of Systems Objective:  BP 126/76   Pulse 64   Temp 97.8 F (36.6 C) (Temporal)   Ht 5\' 11"  (1.803 m)   Wt 283 lb 8 oz (128.6 kg)   SpO2 98%   BMI 39.54 kg/m   Wt Readings from Last 3 Encounters:  07/26/20 283 lb 8 oz (128.6 kg)  07/07/20 284 lb (128.8 kg)  03/31/20 271 lb 9 oz (123.2 kg)      Physical Exam Vitals and nursing note reviewed.  Constitutional:      Appearance: Normal appearance. He is not ill-appearing.  Neck:     Thyroid: No thyroid mass or thyromegaly.  Cardiovascular:     Rate and  Rhythm: Normal rate and regular rhythm.     Pulses: Normal pulses.     Heart sounds: Normal heart sounds. No murmur heard.   Pulmonary:     Effort: Pulmonary effort is normal. No respiratory distress.     Breath sounds: Normal breath sounds. No wheezing, rhonchi or rales.  Musculoskeletal:        General: Normal range of motion.     Right lower leg: No edema.     Left lower leg: No edema.  Skin:    General: Skin is warm and dry.     Findings: No rash.  Neurological:     Mental Status: He is alert.  Psychiatric:        Mood and Affect: Mood normal.        Behavior: Behavior normal.       GAD 7 : Generalized Anxiety Score 07/26/2020 03/31/2020 06/25/2019 02/23/2019  Nervous, Anxious, on Edge 0 3 1 3   Control/stop  worrying 0 3 1 3   Worry too much - different things 0 3 1 3   Trouble relaxing 1 2 1 3   Restless 0 2 1 2   Easily annoyed or irritable 0 2 2 3   Afraid - awful might happen 0 2 2 3   Total GAD 7 Score 1 17 9 20   Anxiety Difficulty Not difficult at all - - -   Assessment & Plan:  This visit occurred during the SARS-CoV-2 public health emergency.  Safety protocols were in place, including screening questions prior to the visit, additional usage of staff PPE, and extensive cleaning of exam room while observing appropriate contact time as indicated for disinfecting solutions.   Problem List Items Addressed This Visit    Subclinical hypothyroidism    Stable period - continue low dose levothyroxine      Anxiety - Primary    Marked improvement since starting sertraline - will continue this along with sparing hydroxyzine use (can be overly sedating)       Essential hypertension    Chronic, stable on current regimen - continue.       OSA on CPAP    Appreciate pulm care. Current CPAP settings are at pressure of 5-10 cmH20       Hearing loss       No orders of the defined types were placed in this encounter.  No orders of the defined types were placed in this encounter.   Patient Instructions  I'm glad you're doing well today.  Continue current regimen.  Return for physical in November, sooner if needed.    Follow up plan: Return in about 8 months (around 03/28/2021) for annual exam, prior fasting for blood work.  , MD

## 2020-07-27 ENCOUNTER — Ambulatory Visit: Payer: Managed Care, Other (non HMO) | Admitting: Cardiology

## 2020-07-31 ENCOUNTER — Telehealth: Payer: Self-pay | Admitting: *Deleted

## 2020-07-31 NOTE — Telephone Encounter (Signed)
Left message on voicemail for patient's wife to call the office back. 

## 2020-07-31 NOTE — Assessment & Plan Note (Signed)
Chronic, stable on current regimen - continue. 

## 2020-07-31 NOTE — Telephone Encounter (Signed)
Patient's wife left a voicemail stating that they want to make sure that it is okay for her husband to take OTC allergy medications Patient's wife stated that he is on a lot of medications and just wanst to make sure it is okay for him to take allergy medication such as Claritin.  Melissa stated that you can call her back or her husband.

## 2020-07-31 NOTE — Telephone Encounter (Signed)
Spoke with patients wife and relayed Dr. Nicanor Alcon recommendations to her. Patients wife verbalized understanding.

## 2020-07-31 NOTE — Assessment & Plan Note (Signed)
Appreciate pulm care. Current CPAP settings are at pressure of 5-10 cmH20

## 2020-07-31 NOTE — Telephone Encounter (Signed)
Ok to take allergy medication like claritin or allegra.  Would avoid anything with "D" decongestant in it.

## 2020-07-31 NOTE — Assessment & Plan Note (Signed)
Stable period - continue low dose levothyroxine

## 2020-07-31 NOTE — Assessment & Plan Note (Signed)
Marked improvement since starting sertraline - will continue this along with sparing hydroxyzine use (can be overly sedating)

## 2020-08-11 ENCOUNTER — Telehealth: Payer: Self-pay | Admitting: Adult Health

## 2020-08-11 NOTE — Telephone Encounter (Signed)
CPAP download shows improved compliance with 90% usage daily average usage at 5.5 hours.  New CPAP pressure looks good.  AHI 1.2 which is very good. Hopefully his new mask is helping.  Continue on current settings and follow-up from previous office visit

## 2020-08-15 NOTE — Telephone Encounter (Signed)
ATC patient x1, left detailed vm per DPR regarding results of download.  Advised to call office with any questions.

## 2020-08-17 NOTE — Telephone Encounter (Signed)
Called and spoke with patient, provided results per Rubye Oaks NP.  He verbalized understanding.  He stated he is still waiting on the new mask from [employer].  His wife contacted [employer] to check on the status, they have received the order, but have not shipped the mask yet.  Nothing further needed at this time.

## 2020-09-08 ENCOUNTER — Other Ambulatory Visit: Payer: Self-pay | Admitting: Family Medicine

## 2020-10-02 ENCOUNTER — Encounter: Payer: Self-pay | Admitting: Family Medicine

## 2020-10-02 DIAGNOSIS — L989 Disorder of the skin and subcutaneous tissue, unspecified: Secondary | ICD-10-CM

## 2020-10-12 ENCOUNTER — Telehealth: Payer: Self-pay | Admitting: Family Medicine

## 2020-10-12 NOTE — Telephone Encounter (Signed)
Called and left detailed message per DPR to schedule cpe and labs. EM

## 2020-10-12 NOTE — Telephone Encounter (Signed)
E-scribed refill.  Plz schedule lab and cpe visits around 03/28/21.

## 2020-10-13 NOTE — Telephone Encounter (Signed)
Left 2nd detailed message for patient to schedule after 03/28/21 for cpe and labs. EM

## 2020-10-16 ENCOUNTER — Encounter: Payer: Self-pay | Admitting: Family Medicine

## 2020-10-16 NOTE — Telephone Encounter (Signed)
Noted  

## 2020-10-16 NOTE — Telephone Encounter (Signed)
Unable to  Reach patient after 3rd attempt. Mailing letter to patient Antonio Ferguson

## 2020-10-23 ENCOUNTER — Ambulatory Visit: Payer: Managed Care, Other (non HMO) | Admitting: Family Medicine

## 2020-10-23 DIAGNOSIS — Z0289 Encounter for other administrative examinations: Secondary | ICD-10-CM

## 2020-10-27 ENCOUNTER — Ambulatory Visit: Payer: Managed Care, Other (non HMO) | Admitting: Cardiology

## 2020-10-31 ENCOUNTER — Other Ambulatory Visit: Payer: Self-pay | Admitting: Family Medicine

## 2020-11-30 ENCOUNTER — Ambulatory Visit: Payer: Managed Care, Other (non HMO) | Admitting: Cardiology

## 2021-01-03 ENCOUNTER — Encounter: Payer: Self-pay | Admitting: Nurse Practitioner

## 2021-01-03 ENCOUNTER — Other Ambulatory Visit: Payer: Self-pay

## 2021-01-03 ENCOUNTER — Ambulatory Visit: Payer: Managed Care, Other (non HMO) | Admitting: Nurse Practitioner

## 2021-01-03 VITALS — BP 118/72 | HR 73 | Ht 72.0 in | Wt 285.1 lb

## 2021-01-03 DIAGNOSIS — I10 Essential (primary) hypertension: Secondary | ICD-10-CM

## 2021-01-03 DIAGNOSIS — R0789 Other chest pain: Secondary | ICD-10-CM | POA: Diagnosis not present

## 2021-01-03 NOTE — Progress Notes (Signed)
Office Visit    Patient Name: Antonio Ferguson Date of Encounter: 01/03/2021  Primary Care Provider:  Eustaquio Boyden, MD Primary Cardiologist:  Debbe Odea, MD  Chief Complaint    45 year old male with a history of hypertension, anxiety, obesity, GERD, and chest pain, who presents for follow-up of related to chest pain and nonobstructive CAD.  Past Medical History    Past Medical History:  Diagnosis Date   Acid reflux    Diastolic dysfunction    a. 11/2019 Echo: EF 50-55%, no rwma, GrII DD, nl RV size/fxn.   GERD (gastroesophageal reflux disease)    rare   Hepatic steatosis    HLD (hyperlipidemia)    Hypertension    Hypothyroid    NAFLD (nonalcoholic fatty liver disease) 1761   s/p [place] and liver biopsy   Non-obstructive coronary artery disease    a. 11/2019 Cor CTA: Cor Ca2+ = 88.7. <25% prox LAD stenosis. Otw nl cors.   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   Past Surgical History:  Procedure Laterality Date   COLONOSCOPY  2003   WNL   FINGER SURGERY Right    LIVER BIOPSY  2003   fatty liver   THYROGLOSSAL DUCT CYST N/A 12/20/2013   Procedure: EXCISION THYROGLOSSAL DUCT CYST;  Surgeon: Serena Colonel, MD;  Location: [facility];  Service: ENT;  Laterality: N/A;   VASECTOMY  2009   WISDOM TOOTH EXTRACTION      Allergies  Allergies  Allergen Reactions   Oxycodone-Acetaminophen Nausea Only   Latex Other (See Comments)    BURNING OF FACE IF LATEX TOUCHES FACE    History of Present Illness    45 year old male with the above past medical history including hypertension, anxiety, obesity, and GERD.  He was evaluated last year secondary to atypical chest pain.  Coronary CT angiography was performed and showed a coronary calcium score of 88.7.  He had less than 25% proximal LAD stenosis and otherwise normal coronary arteries.  Echocardiogram showed an EF of 50 to 55% with grade 2 diastolic dysfunction.  He  has been medically managed with beta-blocker therapy, and was feeling well when last seen in July 2021.  Over the last year, he has continued to do well.  He really feels as though being on metoprolol has made a difference for him.  He and his wife are trying to exercise more and watch what they eat.  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  He denies chest pain, dyspnea, palpitations, PND, orthopnea, dizziness, syncope, edema, or early satiety.  Home Medications    Current Outpatient Medications  Medication Sig Dispense Refill   amLODipine (NORVASC) 5 MG tablet TAKE 1 TABLET BY MOUTH EVERY DAY 90 tablet 1   hydrOXYzine (ATARAX/VISTARIL) 25 MG tablet TAKE 0.5-1 TABLETS (12.5-25 MG TOTAL) BY MOUTH 2 (TWO) TIMES DAILY AS NEEDED FOR ANXIETY. 180 tablet 0   levothyroxine (SYNTHROID) 50 MCG tablet TAKE 1 TABLET BY MOUTH EVERY DAY 90 tablet 2   metoprolol succinate (TOPROL-XL) 25 MG 24 hr tablet TAKE 1.5 TABLETS (37.5 MG TOTAL) BY MOUTH DAILY. TAKE WITH OR IMMEDIATELY FOLLOWING A MEAL. 130 tablet 1   nitroGLYCERIN (NITROSTAT) 0.4 MG SL tablet Place 1 tablet (0.4 mg total) under the tongue every 5 (five) minutes as needed for chest pain. 25 tablet  2   omeprazole (PRILOSEC) 40 MG capsule TAKE 1 CAPSULE (40 MG TOTAL) BY MOUTH DAILY AS NEEDED. 90 capsule 1   sertraline (ZOLOFT) 25 MG tablet TAKE 1 TABLET BY MOUTH EVERY DAY 90 tablet 3   No current facility-administered medications for this visit.     Review of Systems    He denies chest pain, palpitations, dyspnea, pnd, orthopnea, n, v, dizziness, syncope, edema, weight gain, or early satiety.  All other systems reviewed and are otherwise negative except as noted above.  Physical Exam    VS:  BP 118/72 (BP Location: Left Arm, Patient Position: Sitting, Cuff Size: Large)   Pulse 73   Ht 6' (1.829 m)   Wt 285 lb 2 oz (129.3 kg)   SpO2 97%   BMI 38.67 kg/m  , BMI  Body mass index is 38.67 kg/m.     GEN: Well nourished, well developed, in no acute distress. HEENT: normal. Neck: Supple, no JVD, carotid bruits, or masses. Cardiac: RRR, no murmurs, rubs, or gallops. No clubbing, cyanosis, edema.  Radials 2+ and equal bilaterally.  Respiratory:  Respirations regular and unlabored, clear to auscultation bilaterally. GI: Soft, nontender, nondistended, BS + x 4. MS: no deformity or atrophy. Skin: warm and dry, no rash. Neuro:  Strength and sensation are intact. Psych: Normal affect.  Accessory Clinical Findings    ECG personally reviewed by me today -regular sinus rhythm, 73, PAC- no acute changes.  Lab Results  Component Value Date   WBC 5.7 04/14/2019   HGB 15.3 04/14/2019   HCT 44.5 04/14/2019   MCV 88.9 04/14/2019   PLT 198.0 04/14/2019   Lab Results  Component Value Date   CREATININE 0.92 03/29/2020   BUN 13 03/29/2020   NA 138 03/29/2020   K 4.5 03/29/2020   CL 102 03/29/2020   CO2 30 03/29/2020   Lab Results  Component Value Date   ALT 89 (H) 03/29/2020   AST 37 03/29/2020   GGT 32 12/05/2014   ALKPHOS 94 03/29/2020   BILITOT 0.7 03/29/2020   Lab Results  Component Value Date   CHOL 168 03/29/2020   HDL 36.20 (L) 03/29/2020   LDLCALC 94 08/28/2015   LDLDIRECT 98.0 03/29/2020   TRIG 223.0 (H) 03/29/2020   CHOLHDL 5 03/29/2020    Lab Results  Component Value Date   TSH 2.14 03/29/2020     Assessment & Plan    1.  Atypical chest pain/nonobstructive coronary plaque: Patient previously evaluated in 2021 with coronary CT angiography revealing a less than 25% proximal LAD stenosis and otherwise normal coronary arteries.  Echo showed an EF of 50-55% without regional wall motion abnormalities and grade 2 diastolic dysfunction.  He has felt well over the past year without any chest pain, dyspnea, or palpitations.  He remains on metoprolol and amlodipine therapy.  We discussed the importance of regular activity.  He and his wife  are planning to begin a regular exercise program and are also looking at weight loss options.  He is committed to improving his lifestyle.  His 10-year risk of a cardiac event calculates to 6.31%.  In that setting, he is not on a statin.  2.  Essential hypertension: Stable today at 118/72 on amlodipine and metoprolol.  We did discuss the role that activity and weight loss might play in improving blood pressure.  3.  Hypothyroidism: On Synthroid therapy.  TSH was normal last November.  4.  Anxiety: Patient feels that being on sertraline has helped  him quite a bit.    5.  Disposition: Follow-up in 1 year or sooner if necessary.  Nicolasa Ducking, NP 01/03/2021, 2:15 PM

## 2021-01-03 NOTE — Patient Instructions (Addendum)
Medication Instructions:  - Your physician recommends that you continue on your current medications as directed. Please refer to the Current Medication list given to you today.  *If you need a refill on your cardiac medications before your next appointment, please call your pharmacy*   Lab Work: - none ordered  If you have labs (blood work) drawn today and your tests are completely normal, you will receive your results only by: MyChart Message (if you have MyChart) OR A paper copy in the mail If you have any lab test that is abnormal or we need to change your treatment, we will call you to review the results.   Testing/Procedures: - none ordered   Follow-Up: At [facility], you and your health needs are our priority.  As part of our continuing mission to provide you with exceptional heart care, we have created designated Provider Care Teams.  These Care Teams include your primary Cardiologist (physician) and Advanced Practice Providers (APPs -  Physician Assistants and Nurse Practitioners) who all work together to provide you with the care you need, when you need it.  We recommend signing up for the patient portal called "MyChart".  Sign up information is provided on this After Visit Summary.  MyChart is used to connect with patients for Virtual Visits (Telemedicine).  Patients are able to view lab/test results, encounter notes, upcoming appointments, etc.  Non-urgent messages can be sent to your provider as well.   To learn more about what you can do with MyChart, go to [email or web address].    Your next appointment:   1 year(s)  The format for your next appointment:   In Person  Provider:   You may see Debbe Odea, MD or one of the following Advanced Practice Providers on your designated Care Team:   Nicolasa Ducking, NP    Other Instructions N/a

## 2021-01-16 ENCOUNTER — Telehealth: Payer: Self-pay | Admitting: Dermatology

## 2021-01-16 NOTE — Telephone Encounter (Signed)
Referral opened back up. Notes documented and referral routed back to referring office. 

## 2021-01-16 NOTE — Telephone Encounter (Signed)
NP referral won't wait until March; please request referrer try to get him in elsewhere sooner

## 2021-01-17 ENCOUNTER — Encounter: Payer: Self-pay | Admitting: Family Medicine

## 2021-04-13 ENCOUNTER — Other Ambulatory Visit: Payer: Self-pay | Admitting: Family Medicine

## 2021-04-28 ENCOUNTER — Other Ambulatory Visit: Payer: Self-pay | Admitting: Family Medicine

## 2021-04-30 NOTE — Telephone Encounter (Signed)
Please call patient and schedule annual physical. 

## 2021-04-30 NOTE — Telephone Encounter (Signed)
Lvm for pt to call the office to get scheduled for lab/cpe also sent my chart letter

## 2021-05-01 ENCOUNTER — Other Ambulatory Visit: Payer: Self-pay | Admitting: Family Medicine

## 2021-05-01 NOTE — Telephone Encounter (Signed)
Pt stated he will call us back by next week when he get his work schedule. The he will schedule a lab/cpe

## 2021-05-01 NOTE — Telephone Encounter (Signed)
E-scribed refill.  Plz schedule lab and cpe visits to prevent delays in refills.

## 2021-05-30 ENCOUNTER — Other Ambulatory Visit: Payer: Self-pay | Admitting: Family Medicine

## 2021-05-30 NOTE — Telephone Encounter (Signed)
Please call patient and schedule CPE.  

## 2021-06-02 ENCOUNTER — Other Ambulatory Visit: Payer: Self-pay | Admitting: Family Medicine

## 2021-06-04 NOTE — Telephone Encounter (Signed)
Lvm for pt to schedule cpe/lab also sent my chart letter °

## 2021-06-04 NOTE — Telephone Encounter (Signed)
Please call patient and schedule annual physical.  Send back to CMA after the appointment is scheduled.

## 2021-06-06 ENCOUNTER — Encounter: Payer: Self-pay | Admitting: Family Medicine

## 2021-06-06 DIAGNOSIS — Z86010 Personal history of colonic polyps: Secondary | ICD-10-CM

## 2021-06-07 NOTE — Telephone Encounter (Signed)
2nd attemp to reach out to pt to schedule cpe/lab

## 2021-06-12 DIAGNOSIS — Z86010 Personal history of colonic polyps: Secondary | ICD-10-CM | POA: Insufficient documentation

## 2021-06-12 NOTE — Telephone Encounter (Signed)
New GI referral placed. Can we request prior colonoscopy records from [facility] gastroenterology?

## 2021-06-12 NOTE — Addendum Note (Signed)
Addended by: Eustaquio Boyden on: 06/12/2021 01:58 PM   Modules accepted: Orders

## 2021-06-13 ENCOUNTER — Encounter: Payer: Self-pay | Admitting: Gastroenterology

## 2021-06-13 ENCOUNTER — Other Ambulatory Visit: Payer: Self-pay | Admitting: Family Medicine

## 2021-06-13 ENCOUNTER — Encounter: Payer: Self-pay | Admitting: Family Medicine

## 2021-06-13 NOTE — Telephone Encounter (Signed)
Patient was scheduled for CPE in May 2023

## 2021-07-10 ENCOUNTER — Other Ambulatory Visit: Payer: Self-pay | Admitting: Family Medicine

## 2021-07-10 ENCOUNTER — Ambulatory Visit (INDEPENDENT_AMBULATORY_CARE_PROVIDER_SITE_OTHER): Payer: Managed Care, Other (non HMO) | Admitting: Gastroenterology

## 2021-07-10 ENCOUNTER — Other Ambulatory Visit (INDEPENDENT_AMBULATORY_CARE_PROVIDER_SITE_OTHER): Payer: Managed Care, Other (non HMO)

## 2021-07-10 ENCOUNTER — Encounter: Payer: Self-pay | Admitting: Gastroenterology

## 2021-07-10 VITALS — BP 142/88 | HR 71 | Ht 73.0 in | Wt 289.0 lb

## 2021-07-10 DIAGNOSIS — K760 Fatty (change of) liver, not elsewhere classified: Secondary | ICD-10-CM

## 2021-07-10 DIAGNOSIS — Z86010 Personal history of colonic polyps: Secondary | ICD-10-CM | POA: Diagnosis not present

## 2021-07-10 DIAGNOSIS — K219 Gastro-esophageal reflux disease without esophagitis: Secondary | ICD-10-CM

## 2021-07-10 LAB — CBC WITH DIFFERENTIAL/PLATELET
Basophils Absolute: 0 10*3/uL (ref 0.0–0.1)
Basophils Relative: 0.5 % (ref 0.0–3.0)
Eosinophils Absolute: 0.1 10*3/uL (ref 0.0–0.7)
Eosinophils Relative: 2.1 % (ref 0.0–5.0)
HCT: 45.1 % (ref 39.0–52.0)
Hemoglobin: 15.5 g/dL (ref 13.0–17.0)
Lymphocytes Relative: 29.1 % (ref 12.0–46.0)
Lymphs Abs: 2 10*3/uL (ref 0.7–4.0)
MCHC: 34.4 g/dL (ref 30.0–36.0)
MCV: 87 fl (ref 78.0–100.0)
Monocytes Absolute: 0.7 10*3/uL (ref 0.1–1.0)
Monocytes Relative: 10.2 % (ref 3.0–12.0)
Neutro Abs: 3.9 10*3/uL (ref 1.4–7.7)
Neutrophils Relative %: 58.1 % (ref 43.0–77.0)
Platelets: 201 10*3/uL (ref 150.0–400.0)
RBC: 5.18 Mil/uL (ref 4.22–5.81)
RDW: 13 % (ref 11.5–15.5)
WBC: 6.8 10*3/uL (ref 4.0–10.5)

## 2021-07-10 LAB — COMPREHENSIVE METABOLIC PANEL
ALT: 100 U/L — ABNORMAL HIGH (ref 0–53)
AST: 44 U/L — ABNORMAL HIGH (ref 0–37)
Albumin: 4.5 g/dL (ref 3.5–5.2)
Alkaline Phosphatase: 107 U/L (ref 39–117)
BUN: 13 mg/dL (ref 6–23)
CO2: 28 mEq/L (ref 19–32)
Calcium: 9.6 mg/dL (ref 8.4–10.5)
Chloride: 103 mEq/L (ref 96–112)
Creatinine, Ser: 1.06 mg/dL (ref 0.40–1.50)
GFR: 86.06 mL/min (ref >60.00)
Glucose, Bld: 101 mg/dL — ABNORMAL HIGH (ref 70–99)
Potassium: 4.2 mEq/L (ref 3.5–5.1)
Sodium: 138 mEq/L (ref 135–145)
Total Bilirubin: 0.7 mg/dL (ref 0.2–1.2)
Total Protein: 7.3 g/dL (ref 6.0–8.3)

## 2021-07-10 LAB — GAMMA GT: GGT: 35 U/L (ref 7–51)

## 2021-07-10 NOTE — Progress Notes (Signed)
HPI : Antonio Ferguson is a very pleasant 46 year old male with a history of hypertension, fatty liver and sleep apnea who was referred to [place] by Dr. Eustaquio Boyden for consideration of repeat colonoscopy.  Patient states that he underwent a colonoscopy many years ago (he thinks it was in [facility]) due to concern for blood in the stool.  He recalls having polyps that were removed, but does not recall any details or when he was recommended to repeat a colonoscopy.  His paternal grandmother was diagnosed with colon cancer in her 55s, otherwise no family history of colon cancer.  His father has had polyps on his colonoscopies. The patient denies any chronic GI symptoms, but states that his wife has expressed concerned about his bowel frequency.  Patient usually has a bowel movement twice a day, typically shortly after meals.  He reports stools are typically on the looser side, but denies watery stools.  He does have formed solid stools on occasion.  He denies significant urgency, no incontinence.  No nocturnal stools.  He does occasionally have issues with bloating and foul-smelling gas, but this is not a frequent problem.  He denies any unintentional weight loss. He has chronic GERD symptoms which are well controlled with once daily omeprazole.  He states that if he were not taking omeprazole he would have reflux symptoms most days of the week.  No dysphagia.  No family history of esophageal cancer.  He denies any history of regular smoking, but does smoke a cigar a couple times a month. He reports history of fatty liver disease, confirmed on biopsy in 2003.  He does admit to drinking regularly, and admits he probably drinks too much on his days off, but has trouble quantifying his usual consumption.  He underwent HFE testing which was negative in 2020. His most recent liver enzymes I have for review are from November 2021 and show an ALT of 89.  Review of his enzymes going back several years show that  his ALT has been persistently elevated, frequently in the low 100s. He is interested in improving his overall health now that he is getting older.     Past Medical History:  Diagnosis Date   Acid reflux    Diastolic dysfunction    a. 11/2019 Echo: EF 50-55%, no rwma, GrII DD, nl RV size/fxn.   GERD (gastroesophageal reflux disease)    rare   Hepatic steatosis    HLD (hyperlipidemia)    Hypertension    Hypothyroid    NAFLD (nonalcoholic fatty liver disease) 2992   s/p [place] and liver biopsy   Non-obstructive coronary artery disease    a. 11/2019 Cor CTA: Cor Ca2+ = 88.7. <25% prox LAD stenosis. Otw nl cors.   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     Past Surgical History:  Procedure Laterality Date   COLONOSCOPY  2003   WNL   FINGER SURGERY Right    LIVER BIOPSY  2003   fatty liver   THYROGLOSSAL DUCT CYST N/A 12/20/2013   Procedure: EXCISION THYROGLOSSAL DUCT CYST;  Surgeon: Serena Colonel, MD;  Location:  SURGERY CENTER;  Service: ENT;  Laterality: N/A;   VASECTOMY  2009   WISDOM TOOTH EXTRACTION     Family History  Problem Relation Age of Onset   Healthy Mother    Hypertension Father    Hyperlipidemia Father    Diabetes Father  Peripheral Artery Disease Father        iliac stents (smoker)   Diverticulitis Father        needed surgery   Hypothyroidism Brother    CAD Paternal Uncle        CABG   Diabetes Paternal Grandfather    Sudden Cardiac Death Maternal Uncle        and grandfather   CAD Maternal Uncle        several uncles   Cancer Paternal Grandmother        colon   Social History   Tobacco Use   Smoking status: Some Days    Types: Cigars   Smokeless tobacco: Never   Tobacco comments:    3-4  per month  Vaping Use   Vaping Use: Never used  Substance Use Topics   Alcohol use: Yes    Alcohol/week: 0.0 standard drinks    Comment: rare   Drug use: No   Current Outpatient Medications   Medication Sig Dispense Refill   amLODipine (NORVASC) 5 MG tablet TAKE 1 TABLET BY MOUTH EVERY DAY 90 tablet 0   hydrOXYzine (ATARAX/VISTARIL) 25 MG tablet TAKE 0.5-1 TABLETS (12.5-25 MG TOTAL) BY MOUTH 2 (TWO) TIMES DAILY AS NEEDED FOR ANXIETY. 180 tablet 0   levothyroxine (SYNTHROID) 50 MCG tablet TAKE 1 TABLET BY MOUTH EVERY DAY 90 tablet 2   metoprolol succinate (TOPROL-XL) 25 MG 24 hr tablet TAKE 1.5 TABLETS (37.5 MG TOTAL) BY MOUTH DAILY. TAKE WITH OR IMMEDIATELY FOLLOWING A MEAL. 45 tablet 1   nitroGLYCERIN (NITROSTAT) 0.4 MG SL tablet Place 1 tablet (0.4 mg total) under the tongue every 5 (five) minutes as needed for chest pain. 25 tablet 2   omeprazole (PRILOSEC) 40 MG capsule TAKE 1 CAPSULE BY MOUTH DAILY AS NEEDED. 90 capsule 0   sertraline (ZOLOFT) 25 MG tablet TAKE 1 TABLET BY MOUTH EVERY DAY 90 tablet 0   No current facility-administered medications for this visit.   Allergies  Allergen Reactions   Oxycodone-Acetaminophen Nausea Only   Latex Other (See Comments)    BURNING OF FACE IF LATEX TOUCHES FACE     Review of Systems: All systems reviewed and negative except where noted in HPI.    No results found.  Physical Exam: BP (!) 142/88    Pulse 71    Ht 6\' 1"  (1.854 m)    Wt 289 lb (131.1 kg)    BMI 38.13 kg/m  Constitutional: Pleasant,well-developed, Caucasian male in no acute distress. HEENT: Normocephalic and atraumatic. Conjunctivae are normal. No scleral icterus. Neck supple.  Cardiovascular: Normal rate, regular rhythm.  Pulmonary/chest: Effort normal and breath sounds normal. No wheezing, rales or rhonchi. Abdominal: Soft, nondistended, nontender. Bowel sounds active throughout. There are no masses palpable. No hepatomegaly. Extremities: no edema Lymphadenopathy: No cervical adenopathy noted. Neurological: Alert and oriented to person place and time. Skin: Skin is warm and dry. No rashes noted. Psychiatric: Normal mood and affect. Behavior is  normal.  CBC    Component Value Date/Time   WBC 5.7 04/14/2019 1012   RBC 5.00 04/14/2019 1012   HGB 15.3 04/14/2019 1012   HCT 44.5 04/14/2019 1012   PLT 198.0 04/14/2019 1012   MCV 88.9 04/14/2019 1012   MCHC 34.5 04/14/2019 1012   RDW 12.6 04/14/2019 1012   LYMPHSABS 1.7 04/14/2019 1012   MONOABS 0.6 04/14/2019 1012   EOSABS 0.1 04/14/2019 1012   BASOSABS 0.0 04/14/2019 1012    CMP     Component Value  Date/Time   NA 138 03/29/2020 1027   K 4.5 03/29/2020 1027   CL 102 03/29/2020 1027   CO2 30 03/29/2020 1027   GLUCOSE 95 03/29/2020 1027   BUN 13 03/29/2020 1027   CREATININE 0.92 03/29/2020 1027   CREATININE 0.99 08/28/2015 0827   CALCIUM 9.3 03/29/2020 1027   PROT 6.6 03/29/2020 1027   ALBUMIN 4.3 03/29/2020 1027   ALBUMIN 4.4 12/05/2014 0000   AST 37 03/29/2020 1027   AST 31 12/05/2014 0000   ALT 89 (H) 03/29/2020 1027   ALT 75 12/05/2014 0000   ALKPHOS 94 03/29/2020 1027   ALKPHOS 102 12/05/2014 0000   BILITOT 0.7 03/29/2020 1027     ASSESSMENT AND PLAN: 46 year old male with GERD, fatty liver and questionable history of polyps referred for possible repeat colonoscopy.  From a colon cancer risk standpoint, he is average risk based on family history.  We will need to know the details of the polyps that were removed on his previous colonoscopy to determine if he needs a surveillance colonoscopy.  Patient will try to get [place] the details of exactly where his colonoscopy was done so that we can request records.  I reassured him that I did not think any of his lower GI symptoms were concerning, and it is normal to have 2 or 3 bowel movements a day shortly after meals.  It is common to have poorly formed stools on a low fiber diet, and I recommended he increase his dietary fiber intake will consider adding daily fiber supplement such as Metamucil. For his GERD, it sounds like his symptom frequency warrants continued PPI therapy.  We discussed the potential risks and  benefits of chronic PPI use, to include renal insufficiency, decreased bone mineral density, increased risk of GI infection.  We discussed how the other reported risk such as Alzheimer's, coronary artery disease/stroke have not been borne out and are probably unfounded. He does not have enough risk factors to warrant Barrett's screening at this time.  I recommend he continue taking omeprazole for now. Regarding his fatty liver disease, this is likely combination of both alcohol and nonalcoholic fatty liver disease.  I recommended he cut back significantly on his alcohol use based on his chronically elevated liver enzymes, and the fact he has had fatty liver for almost 20 years.  We will get an ultrasound to reassess for steatosis, and also get repeat liver enzymes (CMP, GGT) and CBC.  If his fib 4 is elevated, we will proceed with the elastography.  Fatty liver - Right upper quadrant ultrasound - Counseled on reducing alcohol consumption, weight loss and exercise - CMP, CBC, GGT  Reported history of polyps - Obtain colonoscopy and pathology reports from previous colonoscopy - If no precancerous polyps removed at that time, recommend patient undergo average risk colon cancer screening age 65  GERD -Continue omeprazole - No need for Barrett's screening at this time  Loose stools -Daily Metamucil   Sammy Douthitt E. Tomasa Rand, MD [facility]  CC:  Eustaquio Boyden, MD

## 2021-07-10 NOTE — Patient Instructions (Signed)
If you are age 44 or older, your body mass index should be between 23-30. Your Body mass index is 38.13 kg/m. If this is out of the aforementioned range listed, please consider follow up with your Primary Care Provider.  If you are age 45 or younger, your body mass index should be between 19-25. Your Body mass index is 38.13 kg/m. If this is out of the aformentioned range listed, please consider follow up with your Primary Care Provider.   Your provider has requested that you go to the basement level for lab work before leaving today. Press "B" on the elevator. The lab is located at the first door on the left as you exit the elevator.  Continue Omeprazole daily.   Please purchase Metamucil over the counter. Take as directed.   You will be contacted by [facility] in the next 2 days to arrange a RUQ Ultrasound.  The number on your caller ID will be [phone], please answer when they call.  If you have not heard from them in 2 days please call [phone] to schedule.    The [facility] providers would like to encourage you to use [facility] to communicate with providers for non-urgent requests or questions.  Due to long hold times on the telephone, sending your provider a message by [facility] may be a faster and more efficient way to get a response.  Please allow 48 business hours for a response.  Please remember that this is for non-urgent requests.   It was a pleasure to see you today!  Thank you for trusting me with your gastrointestinal care!    Scott E. Tomasa Rand, MD

## 2021-07-16 NOTE — Progress Notes (Signed)
Antonio Ferguson,  Your liver enzymes continue to be elevated.  The liver enzyme elevation pattern is not suggestive of alcohol-related damage although this still may be a contributor. I would like to rule out some other causes of chronic liver disease (autoimmune, genetic).  I know you have already been tested for viral hepatitis and hemochromatosis, so we will not repeat those labs. Please go to the lab in our basement at your convenience for more blood testing.  You do not have to be fasting.

## 2021-07-17 ENCOUNTER — Ambulatory Visit (HOSPITAL_COMMUNITY)
Admission: RE | Admit: 2021-07-17 | Discharge: 2021-07-17 | Disposition: A | Discharge status: Home / Self Care | Payer: Managed Care, Other (non HMO) | Source: Ambulatory Visit | Attending: Gastroenterology | Admitting: Gastroenterology

## 2021-07-17 ENCOUNTER — Other Ambulatory Visit: Payer: Self-pay

## 2021-07-17 DIAGNOSIS — Z86010 Personal history of colonic polyps: Secondary | ICD-10-CM | POA: Diagnosis present

## 2021-07-17 DIAGNOSIS — K760 Fatty (change of) liver, not elsewhere classified: Secondary | ICD-10-CM | POA: Insufficient documentation

## 2021-07-17 DIAGNOSIS — K219 Gastro-esophageal reflux disease without esophagitis: Secondary | ICD-10-CM | POA: Insufficient documentation

## 2021-07-18 ENCOUNTER — Other Ambulatory Visit: Payer: Self-pay | Admitting: Family Medicine

## 2021-07-18 NOTE — Progress Notes (Signed)
Antonio Ferguson,  ?Your ultrasound again showed fatty change to the liver, but no findings concerning for cirrhosis.  Your Fib4 score (a formula based on your age, liver enzymes and platelet count) suggests that you do not have any advanced scarring of the liver, which is a good thing.   ?Please submit the blood samples for the other tests previously mentioned and continue to try to limit alcohol consumption as much as possible. ? ?The radiologist also noted questionable thickening of the gallbadder that could be consistent with chronic cholecystitis (inflammation of the gallbladder).  Typically patients with this have issues with abdominal pain, nausea/vomiting.  I did not get the sense that you were having these symptoms, but I can put in a referral to General Surgery if you would like to get their opinion on whether you should consider having your gallbladder removed.  Let me know what you would like to do.

## 2021-07-24 ENCOUNTER — Other Ambulatory Visit: Payer: Self-pay | Admitting: Family Medicine

## 2021-08-05 ENCOUNTER — Other Ambulatory Visit: Payer: Self-pay | Admitting: Family Medicine

## 2021-08-09 ENCOUNTER — Encounter: Payer: Self-pay | Admitting: Gastroenterology

## 2021-08-27 ENCOUNTER — Other Ambulatory Visit: Payer: Self-pay | Admitting: Family Medicine

## 2021-08-27 DIAGNOSIS — I10 Essential (primary) hypertension: Secondary | ICD-10-CM

## 2021-08-27 DIAGNOSIS — R7989 Other specified abnormal findings of blood chemistry: Secondary | ICD-10-CM

## 2021-08-27 DIAGNOSIS — E785 Hyperlipidemia, unspecified: Secondary | ICD-10-CM

## 2021-08-27 DIAGNOSIS — E038 Other specified hypothyroidism: Secondary | ICD-10-CM

## 2021-08-27 DIAGNOSIS — R7401 Elevation of levels of liver transaminase levels: Secondary | ICD-10-CM

## 2021-08-28 ENCOUNTER — Other Ambulatory Visit (INDEPENDENT_AMBULATORY_CARE_PROVIDER_SITE_OTHER): Payer: Managed Care, Other (non HMO)

## 2021-08-28 DIAGNOSIS — R7989 Other specified abnormal findings of blood chemistry: Secondary | ICD-10-CM

## 2021-08-28 DIAGNOSIS — I10 Essential (primary) hypertension: Secondary | ICD-10-CM | POA: Diagnosis not present

## 2021-08-28 DIAGNOSIS — R7401 Elevation of levels of liver transaminase levels: Secondary | ICD-10-CM | POA: Diagnosis not present

## 2021-08-28 DIAGNOSIS — E038 Other specified hypothyroidism: Secondary | ICD-10-CM

## 2021-08-28 DIAGNOSIS — E785 Hyperlipidemia, unspecified: Secondary | ICD-10-CM | POA: Diagnosis not present

## 2021-08-28 LAB — COMPREHENSIVE METABOLIC PANEL
ALT: 95 U/L — ABNORMAL HIGH (ref 0–53)
AST: 43 U/L — ABNORMAL HIGH (ref 0–37)
Albumin: 4.2 g/dL (ref 3.5–5.2)
Alkaline Phosphatase: 109 U/L (ref 39–117)
BUN: 15 mg/dL (ref 6–23)
CO2: 32 mEq/L (ref 19–32)
Calcium: 9.6 mg/dL (ref 8.4–10.5)
Chloride: 103 mEq/L (ref 96–112)
Creatinine, Ser: 1.02 mg/dL (ref 0.40–1.50)
GFR: 90.05 mL/min (ref >60.00)
Glucose, Bld: 113 mg/dL — ABNORMAL HIGH (ref 70–99)
Potassium: 4.6 mEq/L (ref 3.5–5.1)
Sodium: 140 mEq/L (ref 135–145)
Total Bilirubin: 0.7 mg/dL (ref 0.2–1.2)
Total Protein: 6.6 g/dL (ref 6.0–8.3)

## 2021-08-28 LAB — MICROALBUMIN / CREATININE URINE RATIO
Creatinine,U: 259.4 mg/dL
Microalb Creat Ratio: 0.5 mg/g (ref 0.0–30.0)
Microalb, Ur: 1.2 mg/dL (ref 0.0–1.9)

## 2021-08-28 LAB — CBC WITH DIFFERENTIAL/PLATELET
Basophils Absolute: 0.1 10*3/uL (ref 0.0–0.1)
Basophils Relative: 0.9 % (ref 0.0–3.0)
Eosinophils Absolute: 0.2 10*3/uL (ref 0.0–0.7)
Eosinophils Relative: 3.1 % (ref 0.0–5.0)
HCT: 43.6 % (ref 39.0–52.0)
Hemoglobin: 15.1 g/dL (ref 13.0–17.0)
Lymphocytes Relative: 33.3 % (ref 12.0–46.0)
Lymphs Abs: 2.1 10*3/uL (ref 0.7–4.0)
MCHC: 34.7 g/dL (ref 30.0–36.0)
MCV: 87.3 fl (ref 78.0–100.0)
Monocytes Absolute: 0.6 10*3/uL (ref 0.1–1.0)
Monocytes Relative: 9.5 % (ref 3.0–12.0)
Neutro Abs: 3.4 10*3/uL (ref 1.4–7.7)
Neutrophils Relative %: 53.2 % (ref 43.0–77.0)
Platelets: 202 10*3/uL (ref 150.0–400.0)
RBC: 4.99 Mil/uL (ref 4.22–5.81)
RDW: 12.9 % (ref 11.5–15.5)
WBC: 6.3 10*3/uL (ref 4.0–10.5)

## 2021-08-28 LAB — TSH: TSH: 2.55 u[IU]/mL (ref 0.35–5.50)

## 2021-08-28 LAB — LIPID PANEL
Cholesterol: 176 mg/dL (ref 0–200)
HDL: 33.8 mg/dL — ABNORMAL LOW (ref >39.00)
NonHDL: 142.12
Total CHOL/HDL Ratio: 5
Triglycerides: 262 mg/dL — ABNORMAL HIGH (ref 0.0–149.0)
VLDL: 52.4 mg/dL — ABNORMAL HIGH (ref 0.0–40.0)

## 2021-08-28 LAB — GAMMA GT: GGT: 32 U/L (ref 7–51)

## 2021-08-28 LAB — FERRITIN: Ferritin: 282.5 ng/mL (ref 22.0–322.0)

## 2021-08-28 LAB — IBC PANEL
Iron: 84 ug/dL (ref 42–165)
Saturation Ratios: 20.8 % (ref 20.0–50.0)
TIBC: 404.6 ug/dL (ref 250.0–450.0)
Transferrin: 289 mg/dL (ref 212.0–360.0)

## 2021-08-28 LAB — LDL CHOLESTEROL, DIRECT: Direct LDL: 102 mg/dL

## 2021-08-28 LAB — T4, FREE: Free T4: 0.87 ng/dL (ref 0.60–1.60)

## 2021-09-04 ENCOUNTER — Other Ambulatory Visit: Payer: Self-pay | Admitting: Family Medicine

## 2021-09-11 ENCOUNTER — Encounter: Payer: Self-pay | Admitting: Family Medicine

## 2021-09-11 ENCOUNTER — Ambulatory Visit (INDEPENDENT_AMBULATORY_CARE_PROVIDER_SITE_OTHER): Payer: Managed Care, Other (non HMO) | Admitting: Family Medicine

## 2021-09-11 VITALS — BP 120/82 | HR 72 | Temp 97.9°F | Ht 71.0 in | Wt 285.1 lb

## 2021-09-11 DIAGNOSIS — K760 Fatty (change of) liver, not elsewhere classified: Secondary | ICD-10-CM

## 2021-09-11 DIAGNOSIS — E785 Hyperlipidemia, unspecified: Secondary | ICD-10-CM | POA: Diagnosis not present

## 2021-09-11 DIAGNOSIS — E038 Other specified hypothyroidism: Secondary | ICD-10-CM

## 2021-09-11 DIAGNOSIS — I10 Essential (primary) hypertension: Secondary | ICD-10-CM

## 2021-09-11 DIAGNOSIS — H9193 Unspecified hearing loss, bilateral: Secondary | ICD-10-CM

## 2021-09-11 DIAGNOSIS — R5383 Other fatigue: Secondary | ICD-10-CM

## 2021-09-11 DIAGNOSIS — K219 Gastro-esophageal reflux disease without esophagitis: Secondary | ICD-10-CM

## 2021-09-11 DIAGNOSIS — R7401 Elevation of levels of liver transaminase levels: Secondary | ICD-10-CM

## 2021-09-11 DIAGNOSIS — Z0000 Encounter for general adult medical examination without abnormal findings: Secondary | ICD-10-CM

## 2021-09-11 DIAGNOSIS — F419 Anxiety disorder, unspecified: Secondary | ICD-10-CM

## 2021-09-11 DIAGNOSIS — R7989 Other specified abnormal findings of blood chemistry: Secondary | ICD-10-CM

## 2021-09-11 MED ORDER — AMLODIPINE BESYLATE 5 MG PO TABS: 5.0000 mg | ORAL_TABLET | Freq: Every day | ORAL | 3 refills | Status: DC

## 2021-09-11 MED ORDER — OMEPRAZOLE 40 MG PO CPDR: 40.0000 mg | DELAYED_RELEASE_CAPSULE | Freq: Every day | ORAL | 3 refills | Status: DC

## 2021-09-11 MED ORDER — LEVOTHYROXINE SODIUM 50 MCG PO TABS: 50.0000 ug | ORAL_TABLET | Freq: Every day | ORAL | 3 refills | Status: DC

## 2021-09-11 MED ORDER — SERTRALINE HCL 25 MG PO TABS: 25.0000 mg | ORAL_TABLET | Freq: Every day | ORAL | 3 refills | Status: DC

## 2021-09-11 MED ORDER — METOPROLOL SUCCINATE ER 25 MG PO TB24: 37.5000 mg | ORAL_TABLET | Freq: Every day | ORAL | 3 refills | Status: DC

## 2021-09-11 NOTE — Assessment & Plan Note (Signed)
Chronic, off medication. Predominantly high triglycerides, low HDL. Encouraged healthy diet choices to improve cholesterol control.  ?The 10-year ASCVD risk score (Arnett DK, et al., 2019) is: 6.5% ?  Values used to calculate the score: ?    Age: 46 years ?    Sex: Male ?    Is Non-Hispanic African American: No ?    Diabetic: No ?    Tobacco smoker: Yes ?    Systolic Blood Pressure: 120 mmHg ?    Is BP treated: Yes ?    HDL Cholesterol: 33.8 mg/dL ?    Total Cholesterol: 176 mg/dL  ?

## 2021-09-11 NOTE — Assessment & Plan Note (Signed)
Preventative protocols reviewed and updated unless pt declined. Discussed healthy diet and lifestyle.  

## 2021-09-11 NOTE — Assessment & Plan Note (Signed)
Great control on current regimen of low dose sertraline and toprol XL - he desires to continue this.  ?

## 2021-09-11 NOTE — Assessment & Plan Note (Signed)
Chronic, stable on low dose levothyroxine - continue ?

## 2021-09-11 NOTE — Assessment & Plan Note (Signed)
Started on regular PPI by GI. Discussed tapering dosing if able.  ?

## 2021-09-11 NOTE — Assessment & Plan Note (Signed)
Tested negative for hemochromatosis, ferritin levels have now normalized. ?

## 2021-09-11 NOTE — Assessment & Plan Note (Signed)
Regularly wearing hearing aids. ?

## 2021-09-11 NOTE — Assessment & Plan Note (Signed)
Encouraged healthy diet and lifestyle choices to affect sustainable weight loss.  ?

## 2021-09-11 NOTE — Assessment & Plan Note (Signed)
Chronic, stable period on Toprol XL and amlodipine.  ?

## 2021-09-11 NOTE — Progress Notes (Signed)
? ? Patient ID: Antonio Ferguson, male    DOB: 12/20/1975, 46 y.o.   MRN: [id number] ? ?This visit was conducted in person. ? ?BP 120/82   Pulse 72   Temp 97.9 ?F (36.6 ?C) (Temporal)   Ht 5\' 11"  (1.803 m)   Wt 285 lb 2 oz (129.3 kg)   SpO2 97%   BMI 39.77 kg/m?   ? ?CC: CPE ?Subjective:  ? ?HPI: ?Antonio Ferguson is a 46 y.o. male presenting on 09/11/2021 for Annual Exam ? ? ?Daughter is graduating HS this year. Son doing great in baseball.  ? ?Anxiety - feels sertraline has significantly helped with this as well as minimized palpitations.  ? ?Joined gym recently - starting to exercise more regularly.  ? ?Hypothyroidism - stable on levothyroxine 11/11/2021 daily.  ? ?Notes recent fatigue - asks about checking T. High stress at work. Sex drive has decreased. No depressed mood.  ? ?Preventative: ?Colon cancer screening - has seen GI. will start age at 15.  ?Flu shot at work ?Tdap 2010, 03/2020 ?COVID vaccine Moderna 07/2019, 08/2019, no booster  ?Seat belt use discussed ?Sunscreen use discussed. No changing moles on skin ?Sleep - averaging 6-8 hours/night - work schedule alternates  ?Non smoker  ?Alcohol - a few beers/month ?Dentist q6 mo  ?Eye exam several years ago - wears readers  ?Wears hearing aides  ? 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   ? ?Relevant past medical, surgical, family and social history reviewed and updated as indicated. Interim medical history since our last visit reviewed. ?Allergies and medications reviewed and updated. ?Outpatient Medications Prior to Visit  ?Medication Sig Dispense Refill  ? Multiple Vitamin (MULTIVITAMIN WITH MINERALS) TABS tablet Take 1 tablet by mouth daily.    ? amLODipine (NORVASC) 5 MG tablet TAKE 1 TABLET BY MOUTH EVERY DAY 90 tablet 0  ? levothyroxine (SYNTHROID) 50 MCG tablet  TAKE 1 TABLET BY MOUTH EVERY DAY 90 tablet 0  ? metoprolol succinate (TOPROL-XL) 25 MG 24 hr tablet TAKE 1.5 TABLETS (37.5 MG TOTAL) BY MOUTH DAILY. TAKE WITH OR IMMEDIATELY FOLLOWING A MEAL. 135 tablet 1  ? omeprazole (PRILOSEC) 40 MG capsule TAKE 1 CAPSULE BY MOUTH EVERY DAY AS NEEDED 90 capsule 0  ? sertraline (ZOLOFT) 25 MG tablet TAKE 1 TABLET BY MOUTH EVERY DAY 90 tablet 0  ? ?No facility-administered medications prior to visit.  ?  ? ?Per HPI unless specifically indicated in ROS section below ?Review of Systems  ?Constitutional:  Negative for activity change, appetite change, chills, fatigue, fever and unexpected weight change.  ?HENT:  Negative for hearing loss.   ?Eyes:  Negative for visual disturbance.  ?Respiratory:  Negative for cough, chest tightness, shortness of breath and wheezing.   ?Cardiovascular:  Negative for chest pain, palpitations and leg swelling.  ?Gastrointestinal:  Positive for diarrhea (looser stools). Negative for abdominal distention, abdominal pain, blood in stool, constipation, nausea and vomiting.  ?     Abd bloating to certain foods ie chinese foods   ?Genitourinary:  Negative for difficulty urinating and hematuria.  ?Musculoskeletal:  Negative for arthralgias, myalgias and neck pain.  ?Skin:  Negative for rash.  ?Neurological:  Negative for dizziness, seizures, syncope and headaches.  ?Hematological:  Negative for adenopathy. Does not bruise/bleed easily.  ?Psychiatric/Behavioral:  Negative for  dysphoric mood. The patient is not nervous/anxious.   ? ?Objective:  ?BP 120/82   Pulse 72   Temp 97.9 ?F (36.6 ?C) (Temporal)   Ht 5\' 11"  (1.803 m)   Wt 285 lb 2 oz (129.3 kg)   SpO2 97%   BMI 39.77 kg/m?   ?Wt Readings from Last 3 Encounters:  ?09/11/21 285 lb 2 oz (129.3 kg)  ?07/10/21 289 lb (131.1 kg)  ?01/03/21 285 lb 2 oz (129.3 kg)  ?  ?  ?Physical Exam ?Vitals and nursing note reviewed.  ?Constitutional:   ?   General: He is not in acute distress. ?   Appearance: Normal  appearance. He is well-developed. He is not ill-appearing.  ?HENT:  ?   Head: Normocephalic and atraumatic.  ?   Right Ear: Hearing, tympanic membrane, ear canal and external ear normal.  ?   Left Ear: Hearing, tympanic membrane, ear canal and external ear normal.  ?   Mouth/Throat:  ?   Mouth: Mucous membranes are moist.  ?   Pharynx: Oropharynx is clear. No oropharyngeal exudate or posterior oropharyngeal erythema.  ?Eyes:  ?   General: No scleral icterus. ?   Extraocular Movements: Extraocular movements intact.  ?   Conjunctiva/sclera: Conjunctivae normal.  ?   Pupils: Pupils are equal, round, and reactive to light.  ?Neck:  ?   Thyroid: No thyroid mass or thyromegaly.  ?Cardiovascular:  ?   Rate and Rhythm: Normal rate and regular rhythm.  ?   Pulses: Normal pulses.     ?     Radial pulses are 2+ on the right side and 2+ on the left side.  ?   Heart sounds: Normal heart sounds. No murmur heard. ?Pulmonary:  ?   Effort: Pulmonary effort is normal. No respiratory distress.  ?   Breath sounds: Normal breath sounds. No wheezing, rhonchi or rales.  ?Abdominal:  ?   General: Bowel sounds are normal. There is no distension.  ?   Palpations: Abdomen is soft. There is no mass.  ?   Tenderness: There is no abdominal tenderness. There is no guarding or rebound.  ?   Hernia: No hernia is present.  ?Musculoskeletal:     ?   General: Normal range of motion.  ?   Cervical back: Normal range of motion and neck supple.  ?   Right lower leg: No edema.  ?   Left lower leg: No edema.  ?Lymphadenopathy:  ?   Cervical: No cervical adenopathy.  ?Skin: ?   General: Skin is warm and dry.  ?   Findings: No rash.  ?Neurological:  ?   General: No focal deficit present.  ?   Mental Status: He is alert and oriented to person, place, and time.  ?Psychiatric:     ?   Mood and Affect: Mood normal.     ?   Behavior: Behavior normal.     ?   Thought Content: Thought content normal.     ?   Judgment: Judgment normal.  ? ?   ?Results for orders  placed or performed in visit on 08/28/21  ?IBC panel  ?Result Value Ref Range  ? Iron 84 42 - 165 ug/dL  ? Transferrin 289.0 212.0 - 360.0 mg/dL  ? Saturation Ratios 20.8 20.0 - 50.0 %  ? TIBC 404.6 250.0 - 450.0 mcg/dL  ?Ferritin  ?Result Value Ref Range  ? Ferritin 282.5 22.0 - 322.0 ng/mL  ?CBC with Differential/Platelet  ?Result Value  Ref Range  ? WBC 6.3 4.0 - 10.5 K/uL  ? RBC 4.99 4.22 - 5.81 Mil/uL  ? Hemoglobin 15.1 13.0 - 17.0 g/dL  ? HCT 43.6 39.0 - 52.0 %  ? MCV 87.3 78.0 - 100.0 fl  ? MCHC 34.7 30.0 - 36.0 g/dL  ? RDW 12.9 11.5 - 15.5 %  ? Platelets 202.0 150.0 - 400.0 K/uL  ? Neutrophils Relative % 53.2 43.0 - 77.0 %  ? Lymphocytes Relative 33.3 12.0 - 46.0 %  ? Monocytes Relative 9.5 3.0 - 12.0 %  ? Eosinophils Relative 3.1 0.0 - 5.0 %  ? Basophils Relative 0.9 0.0 - 3.0 %  ? Neutro Abs 3.4 1.4 - 7.7 K/uL  ? Lymphs Abs 2.1 0.7 - 4.0 K/uL  ? Monocytes Absolute 0.6 0.1 - 1.0 K/uL  ? Eosinophils Absolute 0.2 0.0 - 0.7 K/uL  ? Basophils Absolute 0.1 0.0 - 0.1 K/uL  ?Microalbumin / creatinine urine ratio  ?Result Value Ref Range  ? Microalb, Ur 1.2 0.0 - 1.9 mg/dL  ? Creatinine,U 259.4 mg/dL  ? Microalb Creat Ratio 0.5 0.0 - 30.0 mg/g  ?T4, free  ?Result Value Ref Range  ? Free T4 0.87 0.60 - 1.60 ng/dL  ?TSH  ?Result Value Ref Range  ? TSH 2.55 0.35 - 5.50 uIU/mL  ?Lipid panel  ?Result Value Ref Range  ? Cholesterol 176 0 - 200 mg/dL  ? Triglycerides 262.0 (H) 0.0 - 149.0 mg/dL  ? HDL 33.80 (L) >39.00 mg/dL  ? VLDL 52.4 (H) 0.0 - 40.0 mg/dL  ? Total CHOL/HDL Ratio 5   ? NonHDL 142.12   ?Comprehensive metabolic panel  ?Result Value Ref Range  ? Sodium 140 135 - 145 mEq/L  ? Potassium 4.6 3.5 - 5.1 mEq/L  ? Chloride 103 96 - 112 mEq/L  ? CO2 32 19 - 32 mEq/L  ? Glucose, Bld 113 (H) 70 - 99 mg/dL  ? BUN 15 6 - 23 mg/dL  ? Creatinine, Ser 1.02 0.40 - 1.50 mg/dL  ? Total Bilirubin 0.7 0.2 - 1.2 mg/dL  ? Alkaline Phosphatase 109 39 - 117 U/L  ? AST 43 (H) 0 - 37 U/L  ? ALT 95 (H) 0 - 53 U/L  ? Total Protein 6.6  6.0 - 8.3 g/dL  ? Albumin 4.2 3.5 - 5.2 g/dL  ? GFR 90.05 >60.00 mL/min  ? Calcium 9.6 8.4 - 10.5 mg/dL  ?Gamma GT  ?Result Value Ref Range  ? GGT 32 7 - 51 U/L  ?LDL cholesterol, direct  ?Result Value Ref Range  ? Direc

## 2021-09-11 NOTE — Patient Instructions (Addendum)
Send me dates of Moderna to update your chart.  ?You are doing well today ?Continue current medicines ?Continue working on healthy diet and lifestyle ?Return as needed or in 1 year for next physical ? ?Health Maintenance, Male ?Adopting a healthy lifestyle and getting preventive care are important in promoting health and wellness. Ask your health care provider about: ?The right schedule for you to have regular tests and exams. ?Things you can do on your own to prevent diseases and keep yourself healthy. ?What should I know about diet, weight, and exercise? ?Eat a healthy diet ? ?Eat a diet that includes plenty of vegetables, fruits, low-fat dairy products, and lean protein. ?Do not eat a lot of foods that are high in solid fats, added sugars, or sodium. ?Maintain a healthy weight ?Body mass index (BMI) is a measurement that can be used to identify possible weight problems. It estimates body fat based on height and weight. Your health care provider can help determine your BMI and help you achieve or maintain a healthy weight. ?Get regular exercise ?Get regular exercise. This is one of the most important things you can do for your health. Most adults should: ?Exercise for at least 150 minutes each week. The exercise should increase your heart rate and make you sweat (moderate-intensity exercise). ?Do strengthening exercises at least twice a week. This is in addition to the moderate-intensity exercise. ?Spend less time sitting. Even light physical activity can be beneficial. ?Watch cholesterol and blood lipids ?Have your blood tested for lipids and cholesterol at 44 years of age, then have this test every 5 years. ?You may need to have your cholesterol levels checked more often if: ?Your lipid or cholesterol levels are high. ?You are older than 44 years of age. ?You are at high risk for heart disease. ?What should I know about cancer screening? ?Many types of cancers can be detected early and may often be prevented.  Depending on your health history and family history, you may need to have cancer screening at various ages. This may include screening for: ?Colorectal cancer. ?Prostate cancer. ?Skin cancer. ?Lung cancer. ?What should I know about heart disease, diabetes, and high blood pressure? ?Blood pressure and heart disease ?High blood pressure causes heart disease and increases the risk of stroke. This is more likely to develop in people who have high blood pressure readings or are overweight. ?Talk with your health care provider about your target blood pressure readings. ?Have your blood pressure checked: ?Every 3-5 years if you are 53-57 years of age. ?Every year if you are 35 years old or older. ?If you are between the ages of 74 and 44 and are a current or former smoker, ask your health care provider if you should have a one-time screening for abdominal aortic aneurysm (AAA). ?Diabetes ?Have regular diabetes screenings. This checks your fasting blood sugar level. Have the screening done: ?Once every three years after age 2 if you are at a normal weight and have a low risk for diabetes. ?More often and at a younger age if you are overweight or have a high risk for diabetes. ?What should I know about preventing infection? ?Hepatitis B ?If you have a higher risk for hepatitis B, you should be screened for this virus. Talk with your health care provider to find out if you are at risk for hepatitis B infection. ?Hepatitis C ?Blood testing is recommended for: ?Everyone born from [birth year] through 1963. ?Anyone with known risk factors for hepatitis C. ?Sexually transmitted infections (  STIs) ?You should be screened each year for STIs, including gonorrhea and chlamydia, if: ?You are sexually active and are younger than 44 years of age. ?You are older than 44 years of age and your health care provider tells you that you are at risk for this type of infection. ?Your sexual activity has changed since you were last screened, and you are  at increased risk for chlamydia or gonorrhea. Ask your health care provider if you are at risk. ?Ask your health care provider about whether you are at high risk for HIV. Your health care provider may recommend a prescription medicine to help prevent HIV infection. If you choose to take medicine to prevent HIV, you should first get tested for HIV. You should then be tested every 3 months for as long as you are taking the medicine. ?Follow these instructions at home: ?Alcohol use ?Do not drink alcohol if your health care provider tells you not to drink. ?If you drink alcohol: ?Limit how much you have to 0-2 drinks a day. ?Know how much alcohol is in your drink. In the [place] one drink equals one 12 oz bottle of beer (355 mL), one 5 oz glass of wine (148 mL), or one 1? oz glass of hard liquor (44 mL). ?Lifestyle ?Do not use any products that contain nicotine or tobacco. These products include cigarettes, chewing tobacco, and vaping devices, such as e-cigarettes. If you need help quitting, ask your health care provider. ?Do not use street drugs. ?Do not share needles. ?Ask your health care provider for help if you need support or information about quitting drugs. ?General instructions ?Schedule regular health, dental, and eye exams. ?Stay current with your vaccines. ?Tell your health care provider if: ?You often feel depressed. ?You have ever been abused or do not feel safe at home. ?Summary ?Adopting a healthy lifestyle and getting preventive care are important in promoting health and wellness. ?Follow your health care provider's instructions about healthy diet, exercising, and getting tested or screened for diseases. ?Follow your health care provider's instructions on monitoring your cholesterol and blood pressure. ?This information is not intended to replace advice given to you by your health care provider. Make sure you discuss any questions you have with your health care provider. ?Document Revised: 09/18/2020  Document Reviewed: 09/18/2020 ?Elsevier Patient Education ? 2023 Elsevier Inc. ? ?

## 2021-09-11 NOTE — Assessment & Plan Note (Addendum)
Confirmed on latest [place].  ?He did see GI 2022.  ?Encouraged weight loss, chol and sugar control.  ?

## 2021-09-11 NOTE — Assessment & Plan Note (Signed)
Anticipate nonalcoholic steatohepatitis.  See above. ?

## 2021-09-12 ENCOUNTER — Other Ambulatory Visit (INDEPENDENT_AMBULATORY_CARE_PROVIDER_SITE_OTHER): Payer: Managed Care, Other (non HMO)

## 2021-09-12 DIAGNOSIS — R5383 Other fatigue: Secondary | ICD-10-CM

## 2021-09-12 LAB — TESTOSTERONE: Testosterone: 297.14 ng/dL — ABNORMAL LOW (ref 300.00–890.00)

## 2021-09-14 ENCOUNTER — Encounter: Payer: Self-pay | Admitting: Family Medicine

## 2021-09-14 DIAGNOSIS — R7989 Other specified abnormal findings of blood chemistry: Secondary | ICD-10-CM

## 2021-09-17 DIAGNOSIS — R7989 Other specified abnormal findings of blood chemistry: Secondary | ICD-10-CM | POA: Insufficient documentation

## 2021-09-17 NOTE — Progress Notes (Signed)
Replied via results note section ?Will order confirmatory testosterone labs. ?

## 2021-11-16 ENCOUNTER — Other Ambulatory Visit (INDEPENDENT_AMBULATORY_CARE_PROVIDER_SITE_OTHER): Payer: Managed Care, Other (non HMO)

## 2021-11-16 DIAGNOSIS — R7989 Other specified abnormal findings of blood chemistry: Secondary | ICD-10-CM

## 2021-11-16 LAB — FOLLICLE STIMULATING HORMONE: FSH: 6.9 m[IU]/mL (ref 1.4–18.1)

## 2021-11-16 LAB — LUTEINIZING HORMONE: LH: 5.22 m[IU]/mL (ref 1.50–9.30)

## 2021-11-16 NOTE — Progress Notes (Signed)
lab

## 2021-11-19 LAB — TESTOS,TOTAL,FREE AND SHBG (FEMALE)
Free Testosterone: 67.3 pg/mL (ref 35.0–155.0)
Sex Hormone Binding: 51 nmol/L — ABNORMAL HIGH (ref 10–50)
Testosterone, Total, LC-MS-MS: 455 ng/dL (ref 250–1100)

## 2021-11-19 LAB — CORTISOL-AM, BLOOD: Cortisol - AM: 8.3 ug/dL

## 2021-11-19 LAB — PROLACTIN: Prolactin: 4.4 ng/mL (ref 2.0–18.0)

## 2022-02-18 ENCOUNTER — Other Ambulatory Visit (HOSPITAL_COMMUNITY): Payer: Self-pay | Admitting: Otolaryngology

## 2022-02-18 ENCOUNTER — Other Ambulatory Visit: Payer: Self-pay | Admitting: Otolaryngology

## 2022-02-18 DIAGNOSIS — H903 Sensorineural hearing loss, bilateral: Secondary | ICD-10-CM

## 2022-03-05 ENCOUNTER — Ambulatory Visit
Admission: RE | Admit: 2022-03-05 | Discharge: 2022-03-05 | Disposition: A | Discharge status: Home / Self Care | Payer: Managed Care, Other (non HMO) | Source: Ambulatory Visit | Attending: Otolaryngology | Admitting: Otolaryngology

## 2022-03-05 DIAGNOSIS — H903 Sensorineural hearing loss, bilateral: Secondary | ICD-10-CM | POA: Insufficient documentation

## 2022-03-05 MED ORDER — GADOBUTROL 1 MMOL/ML IV SOLN
10.0000 mL | Freq: Once | INTRAVENOUS | Status: AC | PRN
Start: 2022-03-05 — End: 2022-03-05
  Administered 2022-03-05: 10 mL via INTRAVENOUS

## 2022-03-15 ENCOUNTER — Ambulatory Visit: Payer: Managed Care, Other (non HMO) | Admitting: Family Medicine

## 2022-05-09 ENCOUNTER — Telehealth: Payer: Self-pay | Admitting: Cardiology

## 2022-05-09 NOTE — Telephone Encounter (Signed)
Ok with me 

## 2022-05-09 NOTE — Telephone Encounter (Signed)
Patient would like to switch from Dr. Azucena Cecil to Dr. Kirke Corin. His wife Efraim Kaufmann use to be Dr. Jari Sportsman nurse that is why she would like for him to see Dr. Kirke Corin.  Are you both in agreement with this?

## 2022-05-23 ENCOUNTER — Ambulatory Visit: Payer: Managed Care, Other (non HMO) | Admitting: Cardiovascular Disease

## 2022-07-12 ENCOUNTER — Encounter: Payer: Self-pay | Admitting: Cardiovascular Disease

## 2022-07-12 ENCOUNTER — Ambulatory Visit: Payer: Managed Care, Other (non HMO) | Attending: Cardiovascular Disease | Admitting: Cardiovascular Disease

## 2022-07-12 VITALS — BP 108/78 | HR 74 | Ht 73.0 in | Wt 293.0 lb

## 2022-07-12 DIAGNOSIS — I10 Essential (primary) hypertension: Secondary | ICD-10-CM | POA: Diagnosis not present

## 2022-07-12 DIAGNOSIS — E7849 Other hyperlipidemia: Secondary | ICD-10-CM | POA: Diagnosis not present

## 2022-07-12 DIAGNOSIS — R0609 Other forms of dyspnea: Secondary | ICD-10-CM | POA: Diagnosis not present

## 2022-07-12 NOTE — Patient Instructions (Signed)
Medication Instructions:  No changes *If you need a refill on your cardiac medications before your next appointment, please call your pharmacy*   Lab Work: None ordered If you have labs (blood work) drawn today and your tests are completely normal, you will receive your results only by: [place] (if you have MyChart) OR A paper copy in the mail If you have any lab test that is abnormal or we need to change your treatment, we will call you to review the results.   Testing/Procedures: Your provider has ordered a exercise tolerance test. This test will evaluate the blood supply to your heart muscle during periods of exercise and rest. For this test, you will raise your heart rate by walking on a treadmill at different levels.   you may eat a light breakfast/ lunch prior to your procedure no caffeine for 24 hours prior to your test (coffee, tea, soft drinks, or chocolate)  no smoking/ vaping for 4 hours prior to your test you may take your regular medications the day of your test except for:   - hold Metoprolol bring any inhalers with you to your test wear comfortable clothing & tennis/ non-skid shoes to walk on the treadmill  Your physician has requested that you have an echocardiogram. Echocardiography is a painless test that uses sound waves to create images of your heart. It provides your doctor with information about the size and shape of your heart and how well your heart's chambers and valves are working.   You may receive an ultrasound enhancing agent through an IV if needed to better visualize your heart during the echo. This procedure takes approximately one hour.  There are no restrictions for this procedure.  This will take place at [facility] [id number], [place]   Follow-Up: At [facility], you and your health needs are our priority.  As part of our continuing mission to provide you with exceptional heart care, we have  created designated Provider Care Teams.  These Care Teams include your primary Cardiologist (physician) and Advanced Practice Providers (APPs -  Physician Assistants and Nurse Practitioners) who all work together to provide you with the care you need, when you need it.  We recommend signing up for the patient portal called "MyChart".  Sign up information is provided on this After Visit Summary.  MyChart is used to connect with patients for Virtual Visits (Telemedicine).  Patients are able to view lab/test results, encounter notes, upcoming appointments, etc.  Non-urgent messages can be sent to your provider as well.   To learn more about what you can do with MyChart, go to [email or web address].    Your next appointment:   12 month(s)  Provider:   You may see Dr. Fletcher Anon or one of the following Advanced Practice Providers on your designated Care Team:   Murray Hodgkins, NP Christell Faith, PA-C Cadence Kathlen Mody, PA-C Gerrie Nordmann, NP

## 2022-07-12 NOTE — Progress Notes (Signed)
Cardiology Office Note   Date:  07/12/2022   ID:  Antonio Ferguson, DOB 09-13-1975, MRN [id number]  PCP:  Ria Bush, MD  Cardiologist:   Kathlyn Sacramento, MD   Chief Complaint  Patient presents with   Other    12 month f/u c/o sob and occasional flush feeling breaking out into sweats . Meds reviewed verbally with pt.      History of Present Illness: Antonio Ferguson is a 47 y.o. male who presents for a follow-up visit regarding mild nonobstructive coronary artery disease. He has known history of essential hypertension, hyperlipidemia and GERD. He had cardiac CTA done in July 2021 for atypical chest pain.  It showed a calcium score of 88 with mild nonobstructive plaque in the proximal LAD.  Echocardiogram showed an EF of 50 to 55% with mild left ventricular hypertrophy and indeterminate diastolic function.  He has been doing reasonably well and denies any chest pain.  However, over the last 6 months, he noticed worsening exertional dyspnea and fatigue.  He has not been exercising on a regular basis.  No orthopnea, PND or lower extremity edema.   Past Medical History:  Diagnosis Date   Acid reflux    Diastolic dysfunction    a. 11/2019 Echo: EF 50-55%, no rwma, GrII DD, nl RV size/fxn.   GERD (gastroesophageal reflux disease)    rare   Hepatic steatosis    HLD (hyperlipidemia)    Hypertension    Hypothyroid    NAFLD (nonalcoholic fatty liver disease) 2003   s/p [place] and liver biopsy   Non-obstructive coronary artery disease    a. 11/2019 Cor CTA: Cor Ca2+ = 88.7. <25% prox LAD stenosis. Otw nl cors.   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 90 tablet 3   levothyroxine (SYNTHROID) 50 MCG tablet Take 1 tablet (50 mcg total) by mouth daily. 90 tablet 3   metoprolol succinate (TOPROL-XL) 25 MG 24 hr tablet Take 1.5 tablets (37.5 mg total) by mouth daily. Take with or immediately following a meal. 135 tablet 3   Multiple Vitamin (MULTIVITAMIN WITH MINERALS) TABS tablet Take 1 tablet by mouth daily.     omeprazole (PRILOSEC) 40 MG capsule Take 1 capsule (40 mg total) by mouth daily. 90 capsule 3   sertraline (ZOLOFT) 25 MG tablet Take 1 tablet (25 mg total) by mouth daily. 90 tablet 3   No current facility-administered medications for this visit.    Allergies:   Oxycodone-acetaminophen and Latex    Social History:  The patient  reports that he has been smoking cigars. He has never used smokeless tobacco. He reports current alcohol use. He reports that he does not use drugs.   Family History:  The patient's family history includes CAD in his maternal uncle and paternal uncle; Cancer in his  paternal grandmother; Diabetes in his father and paternal grandfather; Diverticulitis in his father; Healthy in his mother; Hyperlipidemia in his father; Hypertension in his father; Hypothyroidism in his brother; Peripheral Artery Disease in his father; Sudden Cardiac Death in his maternal uncle.    ROS:  Please see the history of present illness.   Otherwise, review of systems are positive for none.   All other systems are reviewed and negative.    PHYSICAL EXAM: VS:  BP 108/78 (BP Location: Left Arm, Patient Position: Sitting, Cuff Size: Large)   Pulse 74   Ht '6\' 1"'$  (1.854 m)   Wt 293 lb (132.9 kg)   SpO2 96%   BMI 38.66 kg/m  , BMI Body mass index is 38.66 kg/m. GEN: Well nourished, well developed, in no  acute distress  HEENT: normal  Neck: no JVD, carotid bruits, or masses Cardiac: RRR; no murmurs, rubs, or gallops,no edema  Respiratory:  clear to auscultation bilaterally, normal work of breathing GI: soft, nontender, nondistended, + BS MS: no deformity or atrophy  Skin: warm and dry, no rash Neuro:  Strength and sensation are intact Psych: euthymic mood, full affect   EKG:  EKG is ordered today. The ekg ordered today demonstrates normal sinus rhythm with no significant ST or T wave changes.   Recent Labs: 08/28/2021: ALT 95; BUN 15; Creatinine, Ser 1.02; Hemoglobin 15.1; Platelets 202.0; Potassium 4.6; Sodium 140; TSH 2.55    Lipid Panel    Component Value Date/Time   CHOL 176 08/28/2021 0732   CHOL 192 12/05/2014 0000   TRIG 262.0 (H) 08/28/2021 0732   TRIG 322 12/05/2014 0000   HDL 33.80 (L) 08/28/2021 0732   CHOLHDL 5 08/28/2021 0732   VLDL 52.4 (H) 08/28/2021 0732   LDLCALC 94 08/28/2015 0827   LDLCALC 94 12/05/2014 0000   LDLDIRECT 102.0 08/28/2021 0732      Wt Readings from Last 3 Encounters:  07/12/22 293 lb (132.9 kg)  09/11/21 285 lb 2 oz (129.3 kg)  07/10/21 289 lb (131.1 kg)          10/22/2019    8:21 AM  PAD Screen  Previous PAD dx? No  Previous surgical procedure? No  Pain with walking? No  Feet/toe relief with dangling? No  Painful, non-healing ulcers? No  Extremities discolored? No      ASSESSMENT AND PLAN:  1.  Mild nonobstructive coronary artery disease: He reports worsening exertional dyspnea which could be angina equivalent.  His baseline EKG is normal.  I requested a treadmill stress test. I discussed with the patient the importance of lifestyle changes in order to decrease the chance of future coronary artery disease and cardiovascular events. We discussed the importance of controlling risk factors, healthy diet as well as regular exercise. I also requested an echocardiogram given that his EF was in the low normal range with  indeterminate diastolic function.  2.  Essential hypertension: His blood pressure is well-controlled on amlodipine and metoprolol.  3.  Hyperlipidemia: Mainly elevated triglyceride most recently was 262.  His most recent LDL was 102.  I discussed the importance of healthy lifestyle changes.    Disposition:   FU with me in 1 year  Signed,  Kathlyn Sacramento, MD  07/12/2022 8:33 AM    [place]

## 2022-07-15 ENCOUNTER — Ambulatory Visit
Admission: RE | Admit: 2022-07-15 | Discharge: 2022-07-15 | Disposition: A | Discharge status: Home / Self Care | Payer: Managed Care, Other (non HMO) | Source: Ambulatory Visit | Attending: Cardiovascular Disease | Admitting: Cardiovascular Disease

## 2022-07-15 DIAGNOSIS — R0609 Other forms of dyspnea: Secondary | ICD-10-CM | POA: Diagnosis present

## 2022-07-15 LAB — EXERCISE TOLERANCE TEST
Angina Index: 0
Base ST Depression (mm): 0 mm
Duke Treadmill Score: 10
Estimated workload: 11.1
Exercise duration (min): 9 min
Exercise duration (sec): 38 s
MPHR: 176 {beats}/min
Peak HR: 179 {beats}/min
Percent HR: 101 %
Rest HR: 71 {beats}/min
ST Depression (mm): 0 mm

## 2022-08-21 ENCOUNTER — Ambulatory Visit: Payer: Managed Care, Other (non HMO) | Attending: Cardiovascular Disease

## 2022-08-21 DIAGNOSIS — R0609 Other forms of dyspnea: Secondary | ICD-10-CM | POA: Diagnosis not present

## 2022-08-21 LAB — ECHOCARDIOGRAM COMPLETE
AR max vel: 3.74 cm2
AV Area VTI: 3.6 cm2
AV Area mean vel: 3.63 cm2
AV Mean grad: 4 mmHg
AV Peak grad: 7.6 mmHg
Ao pk vel: 1.38 m/s
Area-P 1/2: 3.65 cm2
S' Lateral: 4 cm

## 2022-11-02 ENCOUNTER — Other Ambulatory Visit: Payer: Self-pay | Admitting: Family Medicine

## 2022-11-02 DIAGNOSIS — E038 Other specified hypothyroidism: Secondary | ICD-10-CM

## 2022-11-02 DIAGNOSIS — I10 Essential (primary) hypertension: Secondary | ICD-10-CM

## 2022-11-02 DIAGNOSIS — F419 Anxiety disorder, unspecified: Secondary | ICD-10-CM

## 2022-11-04 NOTE — Telephone Encounter (Signed)
Lvmtcb, sent mychart message  

## 2022-11-04 NOTE — Telephone Encounter (Signed)
E-refills.  Plz schedule CPE and fasting lab (no food/drink- except water and/or blk coffee 5 hrs prior) visits for additional refills.

## 2022-11-26 ENCOUNTER — Other Ambulatory Visit: Payer: Self-pay | Admitting: Family Medicine

## 2022-11-26 DIAGNOSIS — K219 Gastro-esophageal reflux disease without esophagitis: Secondary | ICD-10-CM

## 2022-11-26 NOTE — Telephone Encounter (Signed)
Lvmtcb, sent mychart message  

## 2022-11-26 NOTE — Telephone Encounter (Signed)
E-scribed refill  Plz schedule CPE and fasting lab (no food/drink- except water and/or blk coffee 5 hrs prior) visits for additional refills.  

## 2022-11-28 ENCOUNTER — Encounter: Payer: Self-pay | Admitting: Family Medicine

## 2023-02-02 ENCOUNTER — Other Ambulatory Visit: Payer: Self-pay | Admitting: Family Medicine

## 2023-02-02 DIAGNOSIS — E038 Other specified hypothyroidism: Secondary | ICD-10-CM

## 2023-02-02 DIAGNOSIS — F419 Anxiety disorder, unspecified: Secondary | ICD-10-CM

## 2023-02-02 DIAGNOSIS — I10 Essential (primary) hypertension: Secondary | ICD-10-CM

## 2023-02-05 NOTE — Telephone Encounter (Signed)
Lvm for patient tcb and schedule 

## 2023-02-05 NOTE — Telephone Encounter (Signed)
Please call and schedule CPE with fasting labs prior with Dr. Sharen Hones.  Once scheduled, she back to [place] to refill medication.

## 2023-02-07 NOTE — Telephone Encounter (Signed)
E-scribed refills.  Plz schedule CPE and fasting lab (no food/drink- except water and/or blk coffee 5 hrs prior) visits for additional refills.  

## 2023-02-07 NOTE — Telephone Encounter (Signed)
LVM for patient to c/b and schedule.  

## 2023-02-10 NOTE — Telephone Encounter (Signed)
LVM for patient to c/b and schedule.  

## 2023-04-24 ENCOUNTER — Ambulatory Visit: Payer: Managed Care, Other (non HMO) | Admitting: Family Medicine

## 2023-04-24 VITALS — BP 120/80 | HR 72 | Temp 97.7°F | Ht 73.0 in | Wt 296.4 lb

## 2023-04-24 DIAGNOSIS — J208 Acute bronchitis due to other specified organisms: Secondary | ICD-10-CM | POA: Diagnosis not present

## 2023-04-24 DIAGNOSIS — R051 Acute cough: Secondary | ICD-10-CM

## 2023-04-24 MED ORDER — AZITHROMYCIN 250 MG PO TABS: ORAL_TABLET | ORAL | 0 refills | Status: AC

## 2023-04-24 NOTE — Progress Notes (Signed)
[name], MD, CAQ Sports Medicine [facility] [address] [place], [address]  Phone: [phone]  FAX: [phone]  Antonio Ferguson - 47 y.o. male  MRN [id number]  Date of Birth: 22-Jan-1976  Date: 04/24/2023  PCP: Eustaquio Boyden, MD  Referral: Eustaquio Boyden, MD  Chief Complaint  Patient presents with   Cough    Wet cough x 2 to 3 week-"Can't shake it"   Subjective:   Antonio Ferguson is a 47 y.o. very pleasant male patient with Body mass index is 39.1 kg/m. who presents with the following:  Patient presents today with his wife with a prolonged illness that is predominantly coughing.  He does feel as if he is wheezing some and having a lot of cough and intermittent some mild trouble breathing.  Saphenous, essentially has minimal symptoms.  He denies runny nose, sinus congestion, earache, sore throat, nausea, vomiting, diarrhea, fever.  No significant polyarthropathy.  Maybe allergies -does not think he really has significant postnasal drip  No history of asthma or other chronic pulmonary disease  Review of Systems is noted in the HPI, as appropriate  Objective:   BP 120/80 (BP Location: Left Arm, Patient Position: Sitting, Cuff Size: Large)   Pulse 72   Temp 97.7 F (36.5 C) (Temporal)   Ht 6\' 1"  (1.854 m)   Wt 296 lb 6 oz (134.4 kg)   SpO2 98%   BMI 39.10 kg/m    Gen: WDWN, NAD. Globally Non-toxic HEENT: Throat clear, w/o exudate, R TM clear, L TM - good landmarks, No fluid present. NO rhinnorhea.  MMM Frontal sinuses: NT Max sinuses: NT NECK: Anterior cervical  LAD is absent CV: RRR, No M/G/R, cap refill <2 sec PULM: Breathing comfortably in no respiratory distress.  He does have scattered rhonchorous sounds in bilateral lung fields without focal wheezing or crackles  Laboratory and Imaging Data:  Assessment and Plan:     ICD-10-CM   1. Acute cough  R05.1     2. Acute bronchitis due to  other specified organisms  J20.8      Prolonged cough with rhonchorous sounds on exam.  I think at this point given the length of time, we do need to cover the patient for possible atypical pneumonia.  Given the length of symptoms, pertussis cannot be excluded.  Other noninfectious etiologies are possible, however I would not anticipate abnormal lung sounds with allergy driven cough.  Medication Management during today's office visit: Meds ordered this encounter  Medications   azithromycin (ZITHROMAX) 250 MG tablet    Sig: Take 2 tablets (500 mg total) by mouth daily for 1 day, THEN 1 tablet (250 mg total) daily for 4 days.    Dispense:  6 tablet    Refill:  0   Medications Discontinued During This Encounter  Medication Reason   metoprolol succinate (TOPROL-XL) 25 MG 24 hr tablet Completed Course    Orders placed today for conditions managed today: No orders of the defined types were placed in this encounter.   Disposition: No follow-ups on file.  Dr[employer] One speech-to-text software was used for transcription in this dictation.  Possible transcriptional errors can occur using Animal nutritionist.   Signed,  Elpidio Galea. Dyan Labarbera, MD   Outpatient Encounter Medications as of 04/24/2023  Medication Sig   amLODipine (NORVASC) 5 MG tablet TAKE 1 TABLET (5 MG TOTAL) BY MOUTH DAILY.   azithromycin (ZITHROMAX) 250 MG tablet Take 2 tablets (  500 mg total) by mouth daily for 1 day, THEN 1 tablet (250 mg total) daily for 4 days.   levothyroxine (SYNTHROID) 50 MCG tablet TAKE 1 TABLET BY MOUTH EVERY DAY   Multiple Vitamin (MULTIVITAMIN WITH MINERALS) TABS tablet Take 1 tablet by mouth daily.   omeprazole (PRILOSEC) 40 MG capsule TAKE 1 CAPSULE (40 MG TOTAL) BY MOUTH DAILY.   sertraline (ZOLOFT) 25 MG tablet TAKE 1 TABLET (25 MG TOTAL) BY MOUTH DAILY.   [DISCONTINUED] metoprolol succinate (TOPROL-XL) 25 MG 24 hr tablet TAKE 1.5 TABLETS (37.5 MG TOTAL) BY MOUTH DAILY. TAKE WITH OR  IMMEDIATELY FOLLOWING A MEAL.   No facility-administered encounter medications on file as of 04/24/2023.

## 2023-05-05 ENCOUNTER — Other Ambulatory Visit: Payer: Self-pay | Admitting: Family Medicine

## 2023-05-05 DIAGNOSIS — I10 Essential (primary) hypertension: Secondary | ICD-10-CM

## 2023-05-05 NOTE — Telephone Encounter (Signed)
Last CPE 09/2021- needs appt.   Request denied.

## 2023-05-09 ENCOUNTER — Other Ambulatory Visit: Payer: Self-pay | Admitting: Family Medicine

## 2023-05-09 DIAGNOSIS — E038 Other specified hypothyroidism: Secondary | ICD-10-CM

## 2023-05-09 DIAGNOSIS — F419 Anxiety disorder, unspecified: Secondary | ICD-10-CM

## 2023-05-09 DIAGNOSIS — I10 Essential (primary) hypertension: Secondary | ICD-10-CM

## 2023-05-09 NOTE — Telephone Encounter (Signed)
E-scribed 30-day levothyroxine and sertraline refills. All others denied. Pt well overdue for CPE.   Plz schedule CPE and fasting lab (no food/drink- except water and/or blk coffee 5 hrs prior) visits for additional refills.

## 2023-05-09 NOTE — Telephone Encounter (Signed)
Lvmtcb, sent mychart message  

## 2023-05-09 NOTE — Telephone Encounter (Signed)
Noted  

## 2023-05-21 ENCOUNTER — Other Ambulatory Visit: Payer: Self-pay | Admitting: Family Medicine

## 2023-05-21 DIAGNOSIS — K219 Gastro-esophageal reflux disease without esophagitis: Secondary | ICD-10-CM

## 2023-05-21 NOTE — Telephone Encounter (Signed)
 Overdue for CPE- last 09/2021.  Request denied.

## 2023-06-06 ENCOUNTER — Other Ambulatory Visit: Payer: Self-pay | Admitting: Family Medicine

## 2023-06-06 DIAGNOSIS — F419 Anxiety disorder, unspecified: Secondary | ICD-10-CM

## 2023-06-10 ENCOUNTER — Other Ambulatory Visit: Payer: Self-pay | Admitting: Family Medicine

## 2023-06-10 DIAGNOSIS — F419 Anxiety disorder, unspecified: Secondary | ICD-10-CM

## 2023-06-10 DIAGNOSIS — E038 Other specified hypothyroidism: Secondary | ICD-10-CM

## 2023-06-10 NOTE — Telephone Encounter (Signed)
Overdue for visit. Last OV- 09/2021, CPE. Needs CPE.   Requests denied.

## 2023-06-18 ENCOUNTER — Other Ambulatory Visit: Payer: Self-pay | Admitting: Family Medicine

## 2023-06-18 DIAGNOSIS — I10 Essential (primary) hypertension: Secondary | ICD-10-CM

## 2023-06-18 NOTE — Telephone Encounter (Signed)
 Overdue for CPE- last 09/2021. Needs appt.   Request denied.

## 2023-06-23 ENCOUNTER — Encounter: Payer: Self-pay | Admitting: Family Medicine

## 2023-06-23 ENCOUNTER — Other Ambulatory Visit: Payer: Self-pay | Admitting: Family Medicine

## 2023-06-23 DIAGNOSIS — F419 Anxiety disorder, unspecified: Secondary | ICD-10-CM

## 2023-06-23 DIAGNOSIS — E038 Other specified hypothyroidism: Secondary | ICD-10-CM

## 2023-06-23 DIAGNOSIS — I10 Essential (primary) hypertension: Secondary | ICD-10-CM

## 2023-06-23 DIAGNOSIS — K219 Gastro-esophageal reflux disease without esophagitis: Secondary | ICD-10-CM

## 2023-06-23 NOTE — Telephone Encounter (Signed)
 Overdue for CPE- last 09/2021. Needs appt.   Request denied.

## 2023-06-24 ENCOUNTER — Other Ambulatory Visit: Payer: Self-pay | Admitting: Family Medicine

## 2023-06-24 DIAGNOSIS — K219 Gastro-esophageal reflux disease without esophagitis: Secondary | ICD-10-CM

## 2023-06-24 DIAGNOSIS — F419 Anxiety disorder, unspecified: Secondary | ICD-10-CM

## 2023-06-24 DIAGNOSIS — I10 Essential (primary) hypertension: Secondary | ICD-10-CM

## 2023-06-24 DIAGNOSIS — E038 Other specified hypothyroidism: Secondary | ICD-10-CM

## 2023-06-24 MED ORDER — OMEPRAZOLE 40 MG PO CPDR: 40.0000 mg | DELAYED_RELEASE_CAPSULE | Freq: Every day | ORAL | 0 refills | Status: DC

## 2023-06-24 MED ORDER — AMLODIPINE BESYLATE 5 MG PO TABS: 5.0000 mg | ORAL_TABLET | Freq: Every day | ORAL | 0 refills | Status: DC

## 2023-06-24 MED ORDER — SERTRALINE HCL 25 MG PO TABS: 25.0000 mg | ORAL_TABLET | Freq: Every day | ORAL | 0 refills | Status: DC

## 2023-06-24 MED ORDER — LEVOTHYROXINE SODIUM 50 MCG PO TABS: 50.0000 ug | ORAL_TABLET | Freq: Every day | ORAL | 0 refills | Status: DC

## 2023-06-24 NOTE — Addendum Note (Signed)
Addended by: Nanci Pina on: 06/24/2023 04:47 PM   Modules accepted: Orders

## 2023-06-24 NOTE — Telephone Encounter (Signed)
E-scribed 30-day refills until 07/08/23 CPE.

## 2023-06-25 MED ORDER — AMLODIPINE BESYLATE 5 MG PO TABS: 5.0000 mg | ORAL_TABLET | Freq: Every day | ORAL | 0 refills | Status: DC

## 2023-06-25 MED ORDER — OMEPRAZOLE 40 MG PO CPDR: 40.0000 mg | DELAYED_RELEASE_CAPSULE | Freq: Every day | ORAL | 0 refills | Status: DC

## 2023-06-25 MED ORDER — LEVOTHYROXINE SODIUM 50 MCG PO TABS: 50.0000 ug | ORAL_TABLET | Freq: Every day | ORAL | 0 refills | Status: DC

## 2023-06-25 NOTE — Addendum Note (Signed)
Addended by: Nanci Pina on: 06/25/2023 12:05 PM   Modules accepted: Orders

## 2023-06-25 NOTE — Telephone Encounter (Signed)
E-scribed 90-day refills, per request.

## 2023-06-29 ENCOUNTER — Other Ambulatory Visit: Payer: Self-pay | Admitting: Family Medicine

## 2023-06-29 DIAGNOSIS — E038 Other specified hypothyroidism: Secondary | ICD-10-CM

## 2023-06-29 DIAGNOSIS — E785 Hyperlipidemia, unspecified: Secondary | ICD-10-CM

## 2023-06-29 DIAGNOSIS — I10 Essential (primary) hypertension: Secondary | ICD-10-CM

## 2023-06-29 DIAGNOSIS — R7989 Other specified abnormal findings of blood chemistry: Secondary | ICD-10-CM

## 2023-07-01 ENCOUNTER — Other Ambulatory Visit (INDEPENDENT_AMBULATORY_CARE_PROVIDER_SITE_OTHER): Payer: Managed Care, Other (non HMO)

## 2023-07-01 DIAGNOSIS — E785 Hyperlipidemia, unspecified: Secondary | ICD-10-CM

## 2023-07-01 DIAGNOSIS — R7989 Other specified abnormal findings of blood chemistry: Secondary | ICD-10-CM

## 2023-07-01 DIAGNOSIS — E038 Other specified hypothyroidism: Secondary | ICD-10-CM

## 2023-07-01 DIAGNOSIS — I10 Essential (primary) hypertension: Secondary | ICD-10-CM

## 2023-07-01 NOTE — Addendum Note (Signed)
Addended by: Alvina Chou on: 07/01/2023 04:06 PM   Modules accepted: Orders

## 2023-07-02 LAB — COMPREHENSIVE METABOLIC PANEL
AG Ratio: 1.9 (calc) (ref 1.0–2.5)
ALT: 159 U/L — ABNORMAL HIGH (ref 9–46)
AST: 72 U/L — ABNORMAL HIGH (ref 10–40)
Albumin: 4.6 g/dL (ref 3.6–5.1)
Alkaline phosphatase (APISO): 107 U/L (ref 36–130)
BUN: 12 mg/dL (ref 7–25)
CO2: 29 mmol/L (ref 20–32)
Calcium: 10 mg/dL (ref 8.6–10.3)
Chloride: 102 mmol/L (ref 98–110)
Creat: 1.15 mg/dL (ref 0.60–1.29)
Globulin: 2.4 g/dL (ref 1.9–3.7)
Glucose, Bld: 121 mg/dL — ABNORMAL HIGH (ref 65–99)
Potassium: 4.6 mmol/L (ref 3.5–5.3)
Sodium: 140 mmol/L (ref 135–146)
Total Bilirubin: 1 mg/dL (ref 0.2–1.2)
Total Protein: 7 g/dL (ref 6.1–8.1)

## 2023-07-02 LAB — CBC WITH DIFFERENTIAL/PLATELET
Absolute Lymphocytes: 2035 {cells}/uL (ref 850–3900)
Absolute Monocytes: 608 {cells}/uL (ref 200–950)
Basophils Absolute: 38 {cells}/uL (ref 0–200)
Basophils Relative: 0.6 %
Eosinophils Absolute: 147 {cells}/uL (ref 15–500)
Eosinophils Relative: 2.3 %
HCT: 47.2 % (ref 38.5–50.0)
Hemoglobin: 16 g/dL (ref 13.2–17.1)
MCH: 30 pg (ref 27.0–33.0)
MCHC: 33.9 g/dL (ref 32.0–36.0)
MCV: 88.4 fL (ref 80.0–100.0)
MPV: 9.8 fL (ref 7.5–12.5)
Monocytes Relative: 9.5 %
Neutro Abs: 3571 {cells}/uL (ref 1500–7800)
Neutrophils Relative %: 55.8 %
Platelets: 230 10*3/uL (ref 140–400)
RBC: 5.34 10*6/uL (ref 4.20–5.80)
RDW: 12.7 % (ref 11.0–15.0)
Total Lymphocyte: 31.8 %
WBC: 6.4 10*3/uL (ref 3.8–10.8)

## 2023-07-02 LAB — LIPID PANEL
Cholesterol: 189 mg/dL (ref <200)
HDL: 39 mg/dL — ABNORMAL LOW (ref >=40)
LDL Cholesterol (Calc): 116 mg/dL — ABNORMAL HIGH
Non-HDL Cholesterol (Calc): 150 mg/dL — ABNORMAL HIGH (ref <130)
Total CHOL/HDL Ratio: 4.8 (calc) (ref <5.0)
Triglycerides: 222 mg/dL — ABNORMAL HIGH (ref <150)

## 2023-07-02 LAB — MICROALBUMIN / CREATININE URINE RATIO
Creatinine, Urine: 413 mg/dL — ABNORMAL HIGH (ref 20–320)
Microalb Creat Ratio: 3 mg/g{creat} (ref <30)
Microalb, Ur: 1.3 mg/dL

## 2023-07-02 LAB — FERRITIN: Ferritin: 382 ng/mL — ABNORMAL HIGH (ref 38–380)

## 2023-07-02 LAB — TSH: TSH: 2.45 m[IU]/L (ref 0.40–4.50)

## 2023-07-02 LAB — T4, FREE: Free T4: 1 ng/dL (ref 0.8–1.8)

## 2023-07-08 ENCOUNTER — Ambulatory Visit (INDEPENDENT_AMBULATORY_CARE_PROVIDER_SITE_OTHER): Payer: Managed Care, Other (non HMO) | Admitting: Family Medicine

## 2023-07-08 ENCOUNTER — Encounter: Payer: Self-pay | Admitting: Family Medicine

## 2023-07-08 VITALS — BP 124/84 | HR 84 | Temp 98.0°F | Ht 71.5 in | Wt 281.4 lb

## 2023-07-08 DIAGNOSIS — Z0000 Encounter for general adult medical examination without abnormal findings: Secondary | ICD-10-CM | POA: Diagnosis not present

## 2023-07-08 DIAGNOSIS — Z1211 Encounter for screening for malignant neoplasm of colon: Secondary | ICD-10-CM

## 2023-07-08 DIAGNOSIS — R7989 Other specified abnormal findings of blood chemistry: Secondary | ICD-10-CM

## 2023-07-08 DIAGNOSIS — E038 Other specified hypothyroidism: Secondary | ICD-10-CM

## 2023-07-08 DIAGNOSIS — I10 Essential (primary) hypertension: Secondary | ICD-10-CM | POA: Diagnosis not present

## 2023-07-08 DIAGNOSIS — E785 Hyperlipidemia, unspecified: Secondary | ICD-10-CM

## 2023-07-08 DIAGNOSIS — F419 Anxiety disorder, unspecified: Secondary | ICD-10-CM

## 2023-07-08 DIAGNOSIS — H903 Sensorineural hearing loss, bilateral: Secondary | ICD-10-CM

## 2023-07-08 DIAGNOSIS — G4733 Obstructive sleep apnea (adult) (pediatric): Secondary | ICD-10-CM

## 2023-07-08 DIAGNOSIS — K219 Gastro-esophageal reflux disease without esophagitis: Secondary | ICD-10-CM

## 2023-07-08 DIAGNOSIS — R739 Hyperglycemia, unspecified: Secondary | ICD-10-CM

## 2023-07-08 DIAGNOSIS — K7581 Nonalcoholic steatohepatitis (NASH): Secondary | ICD-10-CM

## 2023-07-08 MED ORDER — SERTRALINE HCL 25 MG PO TABS: 25.0000 mg | ORAL_TABLET | Freq: Every day | ORAL | 4 refills | Status: AC

## 2023-07-08 MED ORDER — AMLODIPINE BESYLATE 5 MG PO TABS: 5.0000 mg | ORAL_TABLET | Freq: Every day | ORAL | 4 refills | Status: AC

## 2023-07-08 MED ORDER — OMEPRAZOLE 40 MG PO CPDR
40.0000 mg | DELAYED_RELEASE_CAPSULE | Freq: Every day | ORAL | 4 refills | Status: AC
Start: 2023-07-08 — End: ?

## 2023-07-08 MED ORDER — LEVOTHYROXINE SODIUM 50 MCG PO TABS
50.0000 ug | ORAL_TABLET | Freq: Every day | ORAL | 4 refills | Status: AC
Start: 2023-07-08 — End: ?

## 2023-07-08 NOTE — Assessment & Plan Note (Addendum)
 With chronic transaminitis (ALT>AST) which preceded levothyroxine commencement. S/p GI evaluations in the past including remote liver biopsy (fatty liver) and imaging consistent with fatty liver infiltration.  Encouraged continued efforts towards ongoing weight loss.  Consider full alcohol cessation.

## 2023-07-08 NOTE — Assessment & Plan Note (Signed)
Continues hearing aides.  

## 2023-07-08 NOTE — Assessment & Plan Note (Signed)
 Chronic, stable on amlodipine. Has stopped Toprol XL

## 2023-07-08 NOTE — Assessment & Plan Note (Signed)
 Chronic, stable on low dose levothyroxine since 2020. Continue current regimen.

## 2023-07-08 NOTE — Assessment & Plan Note (Signed)
 -  Continue nightly CPAP

## 2023-07-08 NOTE — Assessment & Plan Note (Signed)
 Preventative protocols reviewed and updated unless pt declined. Discussed healthy diet and lifestyle.

## 2023-07-08 NOTE — Assessment & Plan Note (Signed)
 Chronic, great control on low dose sertraline - continue.

## 2023-07-08 NOTE — Patient Instructions (Addendum)
 Work on [employer] for fatty liver  Increase fatty fish to help lower triglycerides Good to see you today  Return for fasting labs in 4 months for repeat cholesterol, sugar, liver testing.  Return as needed or in 1 year for next physical

## 2023-07-08 NOTE — Assessment & Plan Note (Signed)
 Encourage healthy diet and lifestyle choices to affect sustainable weight loss

## 2023-07-08 NOTE — Assessment & Plan Note (Addendum)
 Tested negative for 2 most common variants for [facility] HFE gene

## 2023-07-08 NOTE — Progress Notes (Addendum)
 Ph: [phone] Fax: [phone]   Patient ID: Antonio Ferguson, male    DOB: 1975/08/09, 48 y.o.   MRN: [id number]  This visit was conducted in person.  BP 124/84   Pulse 84   Temp 98 F (36.7 C) (Oral)   Ht 5' 11.5" (1.816 m)   Wt 281 lb 6 oz (127.6 kg)   SpO2 95%   BMI 38.70 kg/m    CC: CPE Subjective:   HPI: Antonio Ferguson is a 48 y.o. 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   March 2024 - injured R ankle s/p torn ligament treated with boot for several months. Treated with NSAID with benefit.   Seen 04/2023 with dx atypical pneumonia treated with azithromycin. Some improvement initially but symptoms have recurred and now with L>R ears stopped up.   H/o previous concussions.  H/o hearing loss.  Maternal grandmother was deaf, mother has hearing difficulty as well.  Saw ENT 03/2022 - s/p MRI 2024 showing some brain changes - saw neurology 03/2022 with overall reassuring eval.   Anxiety - feels sertraline has significantly helped with this as well as palpitations.  Hypothyroidism - stable on levothyroxine daily.   Cardiac CTA 11/2019 (coronary calcium score 88.7) done for atypical chest pain - mild CAD with nonobstructive plaque to proximal LAD. Saw cardiology Dr Kirke Corin s/p normal exercise treadmill stress test and echocardiogram 07/2022 - EF 50-55%.    Preventative: Colon cancer screening - discussed options, wants Cologuard - ordered ==> see mychart message- pt changed his mind and wants colonoscopy - ordered. Flu shot at work Tdap 2010, 03/2020 COVID vaccine Moderna 07/2019, 08/2019, no booster  Seat belt use discussed Sunscreen use discussed. 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 Interim medical history since our last visit reviewed. Allergies and medications reviewed and updated. Outpatient Medications Prior to Visit  Medication Sig Dispense Refill  . Multiple Vitamin (MULTIVITAMIN WITH MINERALS) TABS tablet Take 1 tablet by mouth daily.    Marland Kitchen amLODipine (NORVASC) 5 MG tablet Take 1 tablet (5 mg total) by mouth daily. 90 tablet 0  . levothyroxine (SYNTHROID) 50 MCG tablet Take 1 tablet (50 mcg total) by mouth daily. 90 tablet 0  . omeprazole (PRILOSEC) 40 MG capsule Take 1 capsule (40 mg total) by mouth daily. 90 capsule 0  . sertraline (ZOLOFT) 25 MG tablet TAKE 1 TABLET (25 MG TOTAL) BY MOUTH DAILY. 90 tablet 0   No facility-administered medications prior to visit.     Per HPI unless specifically indicated in ROS section below Review of Systems  Constitutional:  Negative for activity change, appetite change, chills, fatigue, fever and unexpected weight change.  HENT:  Negative for hearing loss.   Eyes:  Negative for visual disturbance.  Respiratory:  Positive for cough. Negative for chest tightness, shortness of breath and wheezing.  Cardiovascular:  Negative for chest pain, palpitations and leg swelling.  Gastrointestinal:  Negative for abdominal distention, abdominal pain, blood in stool, constipation, diarrhea, nausea and vomiting.  Genitourinary:  Negative for difficulty urinating and hematuria.  Musculoskeletal:  Negative for arthralgias, myalgias and neck pain.  Skin:  Negative for rash.  Neurological:  Positive for headaches (2 wks ago - ?concussion). Negative for dizziness, seizures and syncope.   Hematological:  Negative for adenopathy. Does not bruise/bleed easily.  Psychiatric/Behavioral:  Negative for dysphoric mood. The patient is not nervous/anxious.     Objective:  BP 124/84   Pulse 84   Temp 98 F (36.7 C) (Oral)   Ht 5' 11.5" (1.816 m)   Wt 281 lb 6 oz (127.6 kg)   SpO2 95%   BMI 38.70 kg/m   Wt Readings from Last 3 Encounters:  07/08/23 281 lb 6 oz (127.6 kg)  04/24/23 296 lb 6 oz (134.4 kg)  07/12/22 293 lb (132.9 kg)      Physical Exam Vitals and nursing note reviewed.  Constitutional:      General: He is not in acute distress.    Appearance: Normal appearance. He is well-developed. He is not ill-appearing.  HENT:     Head: Normocephalic and atraumatic.     Right Ear: Hearing, tympanic membrane, ear canal and external ear normal.     Left Ear: Hearing, tympanic membrane, ear canal and external ear normal.     Nose: Nose normal.     Mouth/Throat:     Mouth: Mucous membranes are moist.     Pharynx: Oropharynx is clear. No oropharyngeal exudate or posterior oropharyngeal erythema.  Eyes:     General: No scleral icterus.    Extraocular Movements: Extraocular movements intact.     Conjunctiva/sclera: Conjunctivae normal.     Pupils: Pupils are equal, round, and reactive to light.  Neck:     Thyroid: No thyroid mass or thyromegaly.  Cardiovascular:     Rate and Rhythm: Normal rate and regular rhythm.     Pulses: Normal pulses.          Radial pulses are 2+ on the right side and 2+ on the left side.     Heart sounds: Normal heart sounds. No murmur heard. Pulmonary:     Effort: Pulmonary effort is normal. No respiratory distress.     Breath sounds: Normal breath sounds. No wheezing, rhonchi or rales.  Abdominal:     General: Bowel sounds are normal. There is no distension.     Palpations: Abdomen is soft. There is no mass.     Tenderness: There is no abdominal tenderness. There is no guarding or rebound.     Hernia: No hernia is present.   Musculoskeletal:        General: Normal range of motion.     Cervical back: Normal range of motion and neck supple.     Right lower leg: No edema.     Left lower leg: No edema.  Lymphadenopathy:     Cervical: No cervical adenopathy.  Skin:    General: Skin is warm and dry.     Findings: No rash.  Neurological:     General: No focal deficit present.     Mental Status: He is alert and oriented to person, place, and time.  Psychiatric:        Mood and Affect: Mood normal.        Behavior: Behavior normal.        Thought  Content: Thought content normal.        Judgment: Judgment normal.      Results for orders placed or performed in visit on 07/01/23  Lipid panel   Collection Time: 07/01/23  4:06 PM  Result Value Ref Range   Cholesterol 189 <200 mg/dL   HDL 39 (L) > OR = 40 mg/dL   Triglycerides 782 (H) <150 mg/dL   LDL Cholesterol (Calc) 116 (H) mg/dL (calc)   Total CHOL/HDL Ratio 4.8 <5.0 (calc)   Non-HDL Cholesterol (Calc) 150 (H) <130 mg/dL (calc)  Comprehensive metabolic panel   Collection Time: 07/01/23  4:06 PM  Result Value Ref Range   Glucose, Bld 121 (H) 65 - 99 mg/dL   BUN 12 7 - 25 mg/dL   Creat 9.56 2.13 - 0.86 mg/dL   BUN/Creatinine Ratio SEE NOTE: 6 - 22 (calc)   Sodium 140 135 - 146 mmol/L   Potassium 4.6 3.5 - 5.3 mmol/L   Chloride 102 98 - 110 mmol/L   CO2 29 20 - 32 mmol/L   Calcium 10.0 8.6 - 10.3 mg/dL   Total Protein 7.0 6.1 - 8.1 g/dL   Albumin 4.6 3.6 - 5.1 g/dL   Globulin 2.4 1.9 - 3.7 g/dL (calc)   AG Ratio 1.9 1.0 - 2.5 (calc)   Total Bilirubin 1.0 0.2 - 1.2 mg/dL   Alkaline phosphatase (APISO) 107 36 - 130 U/L   AST 72 (H) 10 - 40 U/L   ALT 159 (H) 9 - 46 U/L  TSH   Collection Time: 07/01/23  4:06 PM  Result Value Ref Range   TSH 2.45 0.40 - 4.50 mIU/L  T4, free   Collection Time: 07/01/23  4:06 PM  Result Value Ref Range   Free T4 1.0 0.8 - 1.8 ng/dL  Microalbumin / creatinine urine ratio   Collection Time: 07/01/23  4:06 PM   Result Value Ref Range   Creatinine, Urine 413 (H) 20 - 320 mg/dL   Microalb, Ur 1.3 mg/dL   Microalb Creat Ratio 3 <30 mg/g creat  Ferritin   Collection Time: 07/01/23  4:06 PM  Result Value Ref Range   Ferritin 382 (H) 38 - 380 ng/mL  CBC with Differential/Platelet   Collection Time: 07/01/23  4:06 PM  Result Value Ref Range   WBC 6.4 3.8 - 10.8 Thousand/uL   RBC 5.34 4.20 - 5.80 Million/uL   Hemoglobin 16.0 13.2 - 17.1 g/dL   HCT 57.8 46.9 - 62.9 %   MCV 88.4 80.0 - 100.0 fL   MCH 30.0 27.0 - 33.0 pg   MCHC 33.9 32.0 - 36.0 g/dL   RDW 52.8 41.3 - 24.4 %   Platelets 230 140 - 400 Thousand/uL   MPV 9.8 7.5 - 12.5 fL   Neutro Abs 3,571 1,500 - 7,800 cells/uL   Absolute Lymphocytes 2,035 850 - 3,900 cells/uL   Absolute Monocytes 608 200 - 950 cells/uL   Eosinophils Absolute 147 15 - 500 cells/uL   Basophils Absolute 38 0 - 200 cells/uL   Neutrophils Relative % 55.8 %   Total Lymphocyte 31.8 %   Monocytes Relative 9.5 %   Eosinophils Relative 2.3 %   Basophils Relative 0.6 %    Assessment & Plan:   Problem List Items Addressed This Visit     Health maintenance examination - Primary (Chronic)   Preventative protocols reviewed and updated unless pt declined. Discussed healthy diet and lifestyle.       Subclinical hypothyroidism   Chronic,  stable on low dose levothyroxine since 2020. Continue current regimen.       Relevant Medications   levothyroxine (SYNTHROID) 50 MCG tablet   Dyslipidemia   Chronic off meds. Reviewed diet choices to improve triglycerides.  Reviewed ASCVD risk as well as recent coronary calcium score.  No strong fmhx CAD.  Check Lp(a) next labs.  The 10-year ASCVD risk score (Arnett DK, et al., 2019) is: 7.4%   Values used to calculate the score:     Age: 89 years     Sex: Male     Is Non-Hispanic African American: No     Diabetic: No     Tobacco smoker: Yes     Systolic Blood Pressure: 124 mmHg     Is BP treated: Yes     HDL Cholesterol:  39 mg/dL     Total Cholesterol: 189 mg/dL       Relevant Orders   Lipid panel   Comprehensive metabolic panel   Lipoprotein A (LPA)   NASH (nonalcoholic steatohepatitis)   With chronic transaminitis (ALT>AST) which preceded levothyroxine commencement. S/p GI evaluations in the past including remote liver biopsy (fatty liver) and imaging consistent with fatty liver infiltration.  Encouraged continued efforts towards ongoing weight loss.  Consider full alcohol cessation.       Relevant Orders   Comprehensive metabolic panel   Severe obesity (BMI 35.0-39.9) with comorbidity (HCC)   Encourage healthy diet and lifestyle choices to affect sustainable weight loss.       GERD (gastroesophageal reflux disease)   Overall stable period on PPI - discussed tapering dose as able.       Relevant Medications   omeprazole (PRILOSEC) 40 MG capsule   Anxiety   Chronic, great control on low dose sertraline - continue.       Relevant Medications   sertraline (ZOLOFT) 25 MG tablet   Essential hypertension   Chronic, stable on amlodipine. Has stopped Toprol XL      Relevant Medications   amLODipine (NORVASC) 5 MG tablet   Elevated ferritin   Tested negative for 2 most common variants for [facility] HFE gene       OSA on CPAP   Continue nightly CPAP      Sensorineural hearing loss (SNHL), bilateral   Continues hearing aides      Other Visit Diagnoses       Special screening for malignant neoplasms, colon         Hyperglycemia       Relevant Orders   Hemoglobin A1c        Meds ordered this encounter  Medications  . amLODipine (NORVASC) 5 MG tablet    Sig: Take 1 tablet (5 mg total) by mouth daily.    Dispense:  90 tablet    Refill:  4  . levothyroxine (SYNTHROID) 50 MCG tablet    Sig: Take 1 tablet (50 mcg total) by mouth daily.    Dispense:  90 tablet    Refill:  4  . omeprazole (PRILOSEC) 40 MG capsule    Sig: Take 1 capsule (40 mg total) by mouth daily.    Dispense:  90  capsule    Refill:  4  . sertraline (ZOLOFT) 25 MG tablet    Sig: Take 1 tablet (25 mg total) by mouth daily.    Dispense:  90 tablet    Refill:  4    Orders Placed This Encounter  Procedures  . Lipid panel    Standing Status:  Future    Expiration Date:   07/07/2024  . Comprehensive metabolic panel    Standing Status:   Future    Expiration Date:   07/07/2024  . Lipoprotein A (LPA)    Standing Status:   Future    Expiration Date:   07/07/2024  . Hemoglobin A1c    Standing Status:   Future    Expiration Date:   07/07/2024    Patient Instructions  Work on weight for fatty liver  Increase fatty fish to help lower triglycerides Good to see you today  Return for fasting labs in 4 months for repeat cholesterol, sugar, liver testing.  Return as needed or in 1 year for next physical   Follow up plan: Return in about 1 year (around 07/07/2024) for annual exam, prior fasting for blood work.  Eustaquio Boyden, MD

## 2023-07-08 NOTE — Addendum Note (Signed)
 Addended by: Eustaquio Boyden on: 07/08/2023 05:07 PM   Modules accepted: Orders

## 2023-07-08 NOTE — Assessment & Plan Note (Signed)
 Overall stable period on PPI - discussed tapering dose as able.

## 2023-07-08 NOTE — Assessment & Plan Note (Addendum)
 Chronic off meds. Reviewed diet choices to improve triglycerides.  Reviewed ASCVD risk as well as recent coronary calcium score.  No strong fmhx CAD.  Check Lp(a) next labs.  The 10-year ASCVD risk score (Arnett DK, et al., 2019) is: 7.4%   Values used to calculate the score:     Age: 48 years     Sex: Male     Is Non-Hispanic African American: No     Diabetic: No     Tobacco smoker: Yes     Systolic Blood Pressure: 124 mmHg     Is BP treated: Yes     HDL Cholesterol: 39 mg/dL     Total Cholesterol: 189 mg/dL

## 2023-10-09 ENCOUNTER — Encounter: Payer: Self-pay | Admitting: Gastroenterology

## 2023-10-17 ENCOUNTER — Other Ambulatory Visit (INDEPENDENT_AMBULATORY_CARE_PROVIDER_SITE_OTHER): Payer: Managed Care, Other (non HMO)

## 2023-10-17 DIAGNOSIS — E785 Hyperlipidemia, unspecified: Secondary | ICD-10-CM

## 2023-10-17 DIAGNOSIS — K7581 Nonalcoholic steatohepatitis (NASH): Secondary | ICD-10-CM

## 2023-10-17 DIAGNOSIS — R739 Hyperglycemia, unspecified: Secondary | ICD-10-CM

## 2023-10-17 LAB — LIPID PANEL
Cholesterol: 206 mg/dL — ABNORMAL HIGH (ref 0–200)
HDL: 30.1 mg/dL — ABNORMAL LOW (ref >39.00)
NonHDL: 176.32
Total CHOL/HDL Ratio: 7
Triglycerides: 470 mg/dL — ABNORMAL HIGH (ref 0.0–149.0)
VLDL: 94 mg/dL — ABNORMAL HIGH (ref 0.0–40.0)

## 2023-10-17 LAB — COMPREHENSIVE METABOLIC PANEL WITH GFR
ALT: 91 U/L — ABNORMAL HIGH (ref 0–53)
AST: 42 U/L — ABNORMAL HIGH (ref 0–37)
Albumin: 4.4 g/dL (ref 3.5–5.2)
Alkaline Phosphatase: 111 U/L (ref 39–117)
BUN: 14 mg/dL (ref 6–23)
CO2: 31 meq/L (ref 19–32)
Calcium: 9.8 mg/dL (ref 8.4–10.5)
Chloride: 101 meq/L (ref 96–112)
Creatinine, Ser: 1.1 mg/dL (ref 0.40–1.50)
GFR: 81.02 mL/min (ref >60.00)
Glucose, Bld: 123 mg/dL — ABNORMAL HIGH (ref 70–99)
Potassium: 4.2 meq/L (ref 3.5–5.1)
Sodium: 138 meq/L (ref 135–145)
Total Bilirubin: 0.5 mg/dL (ref 0.2–1.2)
Total Protein: 7.2 g/dL (ref 6.0–8.3)

## 2023-10-17 LAB — HEMOGLOBIN A1C: Hgb A1c MFr Bld: 5.7 % (ref 4.6–6.5)

## 2023-10-17 LAB — LDL CHOLESTEROL, DIRECT: Direct LDL: 98 mg/dL

## 2023-10-21 LAB — LIPOPROTEIN A (LPA): Lipoprotein (a): <10 nmol/L (ref <75)

## 2023-10-22 ENCOUNTER — Encounter: Payer: Self-pay | Admitting: Family Medicine

## 2023-10-24 ENCOUNTER — Ambulatory Visit: Payer: Self-pay | Admitting: Family Medicine

## 2023-10-27 ENCOUNTER — Encounter

## 2023-10-27 ENCOUNTER — Telehealth: Payer: Self-pay

## 2023-10-27 NOTE — Telephone Encounter (Signed)
 Unable to reach patient for PV apt scheduled at 4:00 PM. 2 attempts made. VM left each time. Pt made aware via VM he will need to call the office by 5 PM to r/s his PV apt to avoid cancellation of his upcoming colonoscopy with Dr. Cherryl Corona.

## 2023-10-27 NOTE — Telephone Encounter (Signed)
 Patient has been rescheduled for tomorrow 6/17 at 4:00 pm. States he thought he was originally scheduled for tomorrow.

## 2023-10-28 ENCOUNTER — Ambulatory Visit (AMBULATORY_SURGERY_CENTER)

## 2023-10-28 VITALS — Ht 72.0 in | Wt 275.0 lb

## 2023-10-28 DIAGNOSIS — Z1211 Encounter for screening for malignant neoplasm of colon: Secondary | ICD-10-CM

## 2023-10-28 MED ORDER — NA SULFATE-K SULFATE-MG SULF 17.5-3.13-1.6 GM/177ML PO SOLN: 1.0000 | Freq: Once | ORAL | 0 refills | Status: AC

## 2023-10-28 NOTE — Progress Notes (Signed)
 Pt's name and DOB verified at the beginning of the pre-visit wit 2 identifiers   Pt denies any difficulty with ambulating,sitting, laying down or rolling side to side  Pt has no issues moving head neck or swallowing  No egg or soy allergy known to patient   No issues known to pt with past sedation with any surgeries or procedures  No FH of Malignant Hyperthermia  Pt is not on home 02   Pt is not on blood thinners   Pt denies issues with constipation   Pt is not on dialysis  Pt denise any abnormal heart rhythms   Pt denies any upcoming cardiac testing  Chart not reviewed by CRNA prior to PV  Visit by phone  Pt states weight is 275 lb    IInstructions reviewed. Pt given  both [facility] main # and MD on call # prior to instructions.  Pt states understanding of instructions. Instructed pt to review instructions again prior to procedure and call main # given if has questions.. Pt states they will.   Instructed pt on where to find instructions on My Chart.

## 2023-10-29 ENCOUNTER — Encounter: Payer: Self-pay | Admitting: Gastroenterology

## 2023-11-11 HISTORY — PX: COLONOSCOPY: SHX174

## 2023-11-11 NOTE — Telephone Encounter (Signed)
 Late entry- replied via result section

## 2023-11-18 ENCOUNTER — Encounter: Payer: Self-pay | Admitting: Gastroenterology

## 2023-11-18 ENCOUNTER — Ambulatory Visit: Admitting: Gastroenterology

## 2023-11-18 VITALS — BP 135/81 | HR 57 | Temp 98.0°F | Resp 13 | Ht 72.0 in | Wt 275.0 lb

## 2023-11-18 DIAGNOSIS — D124 Benign neoplasm of descending colon: Secondary | ICD-10-CM

## 2023-11-18 DIAGNOSIS — Z1211 Encounter for screening for malignant neoplasm of colon: Secondary | ICD-10-CM

## 2023-11-18 DIAGNOSIS — D123 Benign neoplasm of transverse colon: Secondary | ICD-10-CM

## 2023-11-18 MED ORDER — SODIUM CHLORIDE 0.9 % IV SOLN: 500.0000 mL | Freq: Once | INTRAVENOUS | Status: DC

## 2023-11-18 NOTE — Progress Notes (Signed)
 Vss nad trans to pacu

## 2023-11-18 NOTE — Patient Instructions (Signed)
 YOU HAD AN ENDOSCOPIC PROCEDURE TODAY AT THE [facility]:   Refer to the procedure report that was given to you for any specific questions about what was found during the examination.  If the procedure report does not answer your questions, please call your gastroenterologist to clarify.  If you requested that your care partner not be given the details of your procedure findings, then the procedure report has been included in a sealed envelope for you to review at your convenience later.  YOU SHOULD EXPECT: Some feelings of bloating in the abdomen. Passage of more gas than usual.  Walking can help get rid of the air that was put into your GI tract during the procedure and reduce the bloating. If you had a lower endoscopy (such as a colonoscopy or flexible sigmoidoscopy) you may notice spotting of blood in your stool or on the toilet paper. If you underwent a bowel prep for your procedure, you may not have a normal bowel movement for a few days.  Please Note:  You might notice some irritation and congestion in your nose or some drainage.  This is from the oxygen used during your procedure.  There is no need for concern and it should clear up in a day or so.  SYMPTOMS TO REPORT IMMEDIATELY:  Following lower endoscopy (colonoscopy or flexible sigmoidoscopy):  Excessive amounts of blood in the stool  Significant tenderness or worsening of abdominal pains  Swelling of the abdomen that is new, acute  Fever of 100F or higher  Resume previous diet Continue present medications Await pathology results  For urgent or emergent issues, a gastroenterologist can be reached at any hour by calling [phone]. Do not use MyChart messaging for urgent concerns.    DIET:  We do recommend a small meal at first, but then you may proceed to your regular diet.  Drink plenty of fluids but you should avoid alcoholic beverages for 24 hours.  ACTIVITY:  You should plan to take it easy for the rest of  today and you should NOT DRIVE or use heavy machinery until tomorrow (because of the sedation medicines used during the test).    FOLLOW UP: Our staff will call the number listed on your records the next business day following your procedure.  We will call around 7:15- 8:00 am to check on you and address any questions or concerns that you may have regarding the information given to you following your procedure. If we do not reach you, we will leave a message.     If any biopsies were taken you will be contacted by phone or by letter within the next 1-3 weeks.  Please call us  at [phone] if you have not heard about the biopsies in 3 weeks.    SIGNATURES/CONFIDENTIALITY: You and/or your care partner have signed paperwork which will be entered into your electronic medical record.  These signatures attest to the fact that that the information above on your After Visit Summary has been reviewed and is understood.  Full responsibility of the confidentiality of this discharge information lies with you and/or your care-partner.

## 2023-11-18 NOTE — Progress Notes (Signed)
 [facility] History and Physical   Primary Care Physician:  Rilla Baller, MD   Reason for Procedure:   Colon cancer screening  Plan:    Screening colonoscopy     HPI: Antonio Ferguson is a 48 y.o. male undergoing average risk screening colonoscopy.  He has no family history of colon cancer (other than a grandparent) and no chronic GI symptoms.  He reports having a colonoscopy >10 years ago to evaluate bloody stools and believes that polyps were removed (report unavailable).    Past Medical History:  Diagnosis Date   Acid reflux    Arthritis    Degenerative disc disease, lumbar    Diastolic dysfunction    a. 11/2019 Echo: EF 50-55%, no rwma, GrII DD, nl RV size/fxn.   GERD (gastroesophageal reflux disease)    rare   Hepatic steatosis    HLD (hyperlipidemia)    Hypertension    Hypothyroid    Loss of hearing    Bilateral wears hearing aids   NAFLD (nonalcoholic fatty liver disease) 7996   s/p US  and liver biopsy   Non-obstructive coronary artery disease    a. 11/2019 Cor CTA: Cor Ca2+ = 88.7. <25% prox LAD stenosis. Otw nl cors.   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 Authorizing Provider  amLODipine  (NORVASC ) 5 MG tablet Take 1 tablet (5 mg total) by mouth daily. 07/08/23  Yes Rilla Baller, MD  levothyroxine  (SYNTHROID ) 50 MCG tablet Take 1 tablet (50 mcg total) by mouth daily. 07/08/23  Yes Rilla Baller, MD  omeprazole  (PRILOSEC) 40 MG  capsule Take 1 capsule (40 mg total) by mouth daily. 07/08/23  Yes Rilla Baller, MD  sertraline  (ZOLOFT ) 25 MG tablet Take 1 tablet (25 mg total) by mouth daily. 07/08/23  Yes Rilla Baller, MD  Multiple Vitamin (MULTIVITAMIN WITH MINERALS) TABS tablet Take 1 tablet by mouth daily.    [provider]    Current Outpatient Medications  Medication Sig Dispense Refill   amLODipine  (NORVASC ) 5 MG tablet Take 1 tablet (5 mg total) by mouth daily. 90 tablet 4   levothyroxine  (SYNTHROID ) 50 MCG tablet Take 1 tablet (50 mcg total) by mouth daily. 90 tablet 4   omeprazole  (PRILOSEC) 40 MG capsule Take 1 capsule (40 mg total) by mouth daily. 90 capsule 4   sertraline  (ZOLOFT ) 25 MG tablet Take 1 tablet (25 mg total) by mouth daily. 90 tablet 4   Multiple Vitamin (MULTIVITAMIN WITH MINERALS) TABS tablet Take 1 tablet by mouth daily.     Current Facility-Administered Medications  Medication Dose Route Frequency Provider Last Rate Last Admin   0.9 %  sodium chloride  infusion  500 mL Intravenous Once Stacia Glendia BRAVO, MD        Allergies as of 11/18/2023 - Review Complete 11/18/2023  Allergen Reaction Noted   Latex Other (See Comments) 12/13/2013   Oxycodone -acetaminophen   Nausea Only 06/26/2015   Oxycodone -acetaminophen  Nausea Only 10/28/2023    Family History  Problem Relation Age of Onset   Healthy Mother    Hypertension Father    Hyperlipidemia Father    Diabetes Father    Peripheral Artery Disease Father        iliac stents (smoker)   Diverticulitis Father        needed surgery   Hypothyroidism Brother    Sudden Cardiac Death Maternal Uncle        and grandfather   CAD Maternal Uncle        several uncles   CAD Paternal Uncle        CABG   Cancer Paternal Grandmother        colon   Diabetes Paternal Grandfather    Colon cancer Neg Hx    Colon polyps Neg Hx    Esophageal cancer Neg Hx    Rectal cancer Neg Hx    Stomach cancer Neg Hx     Social History    Socioeconomic History   Marital status: Married    Spouse name: Not on file   Number of children: Not on file   Years of education: Not on file   Highest education level: Associate degree: occupational, Scientist, product/process development, or vocational program  Occupational History   Occupation: POLICE    Comment: [employer]  Tobacco Use   Smoking status: Some Days    Types: Cigars   Smokeless tobacco: Never   Tobacco comments:    3-4  per month  Vaping Use   Vaping status: Never Used  Substance and Sexual Activity   Alcohol use: Yes    Alcohol/week: 0.0 standard drinks of alcohol    Comment: rare   Drug use: No   Sexual activity: Not on file  Other Topics Concern   Not on file  Social History Narrative   [place]   Lives with wife Banker) and 2 children, 3 dogs   Occupation: Emergency planning/management officer [employer]   Edu: Associate's degree   Activity: training dogs, running   Diet: good water, fruits/vegetables some   Social Drivers of Corporate investment banker Strain: Low Risk  (04/24/2023)   Overall Financial Resource Strain (CARDIA)    Difficulty of Paying Living Expenses: Not hard at all  Food Insecurity: No Food Insecurity (04/24/2023)   Hunger Vital Sign    Worried About Running Out of Food in the Last Year: Never true    Ran Out of Food in the Last Year: Never true  Transportation Needs: No Transportation Needs (04/24/2023)   PRAPARE - Administrator, Civil Service (Medical): No    Lack of Transportation (Non-Medical): No  Physical Activity: Unknown (04/24/2023)   Exercise Vital Sign    Days of Exercise per Week: 0 days    Minutes of Exercise per Session: Not on file  Stress: Stress Concern Present (04/24/2023)   [employer] of Occupational Health - Occupational Stress Questionnaire    Feeling of Stress : Rather much  Social Connections: Socially Integrated (04/24/2023)   Social Connection and Isolation Panel    Frequency of Communication with Friends and  Family: More than three times a week    Frequency of Social Gatherings with Friends and Family: More than three times a week    Attends Religious Services: More than 4 times per year    Active Member of [employer] or Organizations: Yes    Attends Banker Meetings:  More than 4 times per year    Marital Status: Married  Catering manager Violence: Not on file    Review of Systems:  All other review of systems negative except as mentioned in the HPI.  Physical Exam: Vital signs BP 108/75   Pulse 74   Temp 98 F (36.7 C) (Temporal)   Ht 6' (1.829 m)   Wt 275 lb (124.7 kg)   SpO2 100%   BMI 37.30 kg/m   General:   Alert,  Well-developed, well-nourished, pleasant and cooperative in NAD Airway:  Mallampati 3 Lungs:  Clear throughout to auscultation.   Heart:  Regular rate and rhythm; no murmurs, clicks, rubs,  or gallops. Abdomen:  Soft, nontender and nondistended. Normal bowel sounds.   Neuro/Psych:  Normal mood and affect. A and O x 3   Higinio Grow E. Stacia, MD [facility]

## 2023-11-18 NOTE — Op Note (Signed)
  [facility] Patient Name: Antonio Ferguson Procedure Date: 11/18/2023 8:38 AM MRN: [id number] Endoscopist: Glendia E. Stacia , MD, [id number] Age: 48 Referring MD:  Date of Birth: [birth year] Gender: Male Account #: [id number] Procedure:                Colonoscopy Indications:              Screening for colorectal malignant neoplasm Medicines:                Monitored Anesthesia Care Procedure:                Pre-Anesthesia Assessment:                           - Prior to the procedure, a History and Physical                            was performed, and patient medications and                            allergies were reviewed. The patient's tolerance of                            previous anesthesia was also reviewed. The risks                            and benefits of the procedure and the sedation                            options and risks were discussed with the patient.                            All questions were answered, and informed consent                            was obtained. Prior Anticoagulants: The patient has                            taken no anticoagulant or antiplatelet agents. ASA                            Grade Assessment: III - A patient with severe                            systemic disease. After reviewing the risks and                            benefits, the patient was deemed in satisfactory                            condition to undergo the procedure.                           After obtaining informed consent, the colonoscope  was passed under direct vision. Throughout the                            procedure, the patient's blood pressure, pulse, and                            oxygen saturations were monitored continuously. The                            CF [id number] #[id number] was introduced through the anus                            and advanced to the the terminal ileum, with                             identification of the appendiceal orifice and IC                            valve. The colonoscopy was performed without                            difficulty. The patient tolerated the procedure                            well. The quality of the bowel preparation was                            good. The terminal ileum, ileocecal valve,                            appendiceal orifice, and rectum were photographed.                            The bowel preparation used was SUPREP via split                            dose instruction. Scope In: 8:54:03 AM Scope Out: 9:07:20 AM Scope Withdrawal Time: 0 hours 10 minutes 24 seconds  Total Procedure Duration: 0 hours 13 minutes 17 seconds  Findings:                 The perianal and digital rectal examinations were                            normal. Pertinent negatives include normal                            sphincter tone and no palpable rectal lesions.                           A 3 mm polyp was found in the distal transverse                            colon. The polyp was sessile. The polyp was removed  with a cold snare. Resection and retrieval were                            complete. Estimated blood loss was minimal.                           A 5 mm polyp was found in the descending colon. The                            polyp was sessile. The polyp was removed with a                            cold snare. Resection and retrieval were complete.                            Estimated blood loss was minimal.                           The exam was otherwise normal throughout the                            examined colon.                           The terminal ileum appeared normal.                           The retroflexed view of the distal rectum and anal                            verge was normal and showed no anal or rectal                            abnormalities. Complications:            No immediate  complications. Estimated Blood Loss:     Estimated blood loss was minimal. Impression:               - One 3 mm polyp in the distal transverse colon,                            removed with a cold snare. Resected and retrieved.                           - One 5 mm polyp in the descending colon, removed                            with a cold snare. Resected and retrieved.                           - The examined portion of the ileum was normal.                           - The distal rectum and anal verge are normal  on                            retroflexion view. Recommendation:           - Patient has a contact number available for                            emergencies. The signs and symptoms of potential                            delayed complications were discussed with the                            patient. Return to normal activities tomorrow.                            Written discharge instructions were provided to the                            patient.                           - Resume previous diet.                           - Continue present medications.                           - Await pathology results.                           - Repeat colonoscopy (date not yet determined) for                            surveillance based on pathology results. Bhakti Labella E. Stacia, MD 11/18/2023 9:11:16 AM This report has been signed electronically.

## 2023-11-18 NOTE — Progress Notes (Signed)
 Called to room to assist during endoscopic procedure.  Patient ID and intended procedure confirmed with present staff. Received instructions for my participation in the procedure from the performing physician.

## 2023-11-18 NOTE — Progress Notes (Signed)
 Pt's states no medical or surgical changes since previsit or office visit.

## 2023-11-19 ENCOUNTER — Telehealth: Payer: Self-pay

## 2023-11-19 NOTE — Telephone Encounter (Signed)
  Follow up Call-     11/18/2023    7:30 AM  Call back number  Post procedure Call Back phone  # [phone]  Permission to leave phone message Yes     Patient questions:  Do you have a fever, pain , or abdominal swelling? No. Pain Score  0 *  Have you tolerated food without any problems? Yes.    Have you been able to return to your normal activities? Yes.    Do you have any questions about your discharge instructions: Diet   No. Medications  No. Follow up visit  No.  Do you have questions or concerns about your Care? No.  Actions: * If pain score is 4 or above: No action needed, pain <4.

## 2023-11-21 LAB — SURGICAL PATHOLOGY

## 2023-11-24 ENCOUNTER — Ambulatory Visit: Payer: Self-pay | Admitting: Gastroenterology

## 2023-11-24 NOTE — Progress Notes (Signed)
 Antonio Ferguson,  The two polyps which I removed during your recent procedure were proven to be completely benign but are considered pre-cancerous polyps that MAY have grown into cancer if they had not been removed.  Studies shows that at least 20% of women over age 46 and 30% of men over age 21 have pre-cancerous polyps.  Based on current nationally recognized surveillance guidelines, I recommend that you have a repeat colonoscopy in 7 years.   If you develop any new rectal bleeding, abdominal pain or significant bowel habit changes, please contact me before then.

## 2023-11-26 ENCOUNTER — Encounter: Payer: Self-pay | Admitting: Family Medicine

## 2024-04-12 ENCOUNTER — Encounter: Payer: Self-pay | Admitting: Family Medicine

## 2024-04-12 ENCOUNTER — Ambulatory Visit: Admitting: Family Medicine

## 2024-04-12 DIAGNOSIS — R7303 Prediabetes: Secondary | ICD-10-CM

## 2024-04-12 DIAGNOSIS — R5383 Other fatigue: Secondary | ICD-10-CM

## 2024-04-12 DIAGNOSIS — G4733 Obstructive sleep apnea (adult) (pediatric): Secondary | ICD-10-CM | POA: Diagnosis not present

## 2024-04-12 DIAGNOSIS — E038 Other specified hypothyroidism: Secondary | ICD-10-CM | POA: Diagnosis not present

## 2024-04-12 DIAGNOSIS — K7581 Nonalcoholic steatohepatitis (NASH): Secondary | ICD-10-CM | POA: Diagnosis not present

## 2024-04-12 DIAGNOSIS — F419 Anxiety disorder, unspecified: Secondary | ICD-10-CM

## 2024-04-12 NOTE — Assessment & Plan Note (Signed)
 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 2023 RUQ US  reviewed.  Update labs today for updated Fib4 score.

## 2024-04-12 NOTE — Assessment & Plan Note (Addendum)
 Chronic, update levothyroxine  levels with recent weight gain.

## 2024-04-12 NOTE — Assessment & Plan Note (Signed)
 Continues doing well on low dose sertraline  25mg  daily.

## 2024-04-12 NOTE — Assessment & Plan Note (Addendum)
 Continue nightly CPAP - last saw pulm 2022 - will call for f/u visit  to check settings.

## 2024-04-12 NOTE — Progress Notes (Addendum)
 Ph: [phone] Fax: [phone]   Patient ID: Antonio Ferguson, male    DOB: [birth year], 48 y.o.   MRN: [id number]  This visit was conducted in person.  BP 124/86   Pulse 84   Temp 98.6 F (37 C) (Oral)   Ht 5' 11.46 (1.815 m)   Wt 290 lb (131.5 kg)   SpO2 98%   BMI 39.93 kg/m    CC: discuss weight, sleep  Subjective:   HPI: Antonio Ferguson is a 48 y.o. 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  Worsening fatigue over the past year.  Notes increase in exertional dyspnea with minimal activity.  Gained weight over the past year - this started after ankle tendon followed by biceps tendon injury - this has significantly limited activity. Notes ongoing back pain. Notes unexpected weight gain.  He may be interested in weight loss medications. No fmhx MTC or MEN2.  Fatigue mildly decreased.  Mood ok.  Libido ok.   Recent labs 10/2023:  A1c 5.7%, TC 206, trig 470, HDL 30, LDL 98, nonHDL 176, Lpa <10, glu 123, Cr 1.1, AST 42, ALT 91. ?Fatty liver disease.  Fibrosis 4 Score = .88 (Low risk)      Interpretation for patients with NAFLD          <1.30       -  F0-F1 (Low risk)          1.30-2.67 -  Indeterminate           >2.67      -  F3-F4 (High risk)     Validated for ages 34-65     Score is based on outdated labs. ALT, AST, and platelets should all be measured within the last 6 months for an accurate FIB-4 Score   Hypothyroid - continues levothyroxine  50mcg daily with benefit.  Lab Results  Component Value Date   TSH 2.45 07/01/2023  Also continues sertraline  with benefit to anxiety/mood/palpitations.   Cardiac CTA 11/2019 with CCS 88.7 - age outside of range for %.   Worsening nightmares, vivid dreams over the past 1-2 months, may have started after stressful period with father's  hospitalization.  OSA on CPAP - uses oral appliance as well as normal face mask.      Relevant past medical, surgical, family and social history reviewed and updated as indicated. Interim medical history since our last visit reviewed. Allergies and medications reviewed and updated. Outpatient Medications Prior to Visit  Medication Sig Dispense Refill   amLODipine  (NORVASC ) 5 MG tablet Take 1 tablet (5 mg total) by mouth daily. 90 tablet 4   levothyroxine  (SYNTHROID ) 50 MCG tablet Take 1 tablet (50 mcg total) by mouth daily. 90 tablet 4   meloxicam (MOBIC) 15 MG tablet Take 15 mg by mouth daily.     Multiple Vitamin (MULTIVITAMIN WITH MINERALS) TABS tablet Take 1 tablet by mouth daily.     omeprazole  (PRILOSEC) 40 MG capsule Take 1 capsule (40 mg total) by mouth daily. 90 capsule 4   sertraline  (ZOLOFT ) 25 MG tablet Take 1 tablet (25 mg total) by mouth daily. 90 tablet 4   No facility-administered medications prior to visit.     Per HPI unless specifically indicated in ROS section below Review of Systems  Objective:  BP 124/86   Pulse 84  Temp 98.6 F (37 C) (Oral)   Ht 5' 11.46 (1.815 m)   Wt 290 lb (131.5 kg)   SpO2 98%   BMI 39.93 kg/m   Wt Readings from Last 3 Encounters:  04/12/24 290 lb (131.5 kg)  11/18/23 275 lb (124.7 kg)  10/28/23 275 lb (124.7 kg)      Physical Exam Vitals and nursing note reviewed.  Constitutional:      Appearance: Normal appearance. He is not ill-appearing.  HENT:     Mouth/Throat:     Mouth: Mucous membranes are moist.     Pharynx: Oropharynx is clear. No oropharyngeal exudate or posterior oropharyngeal erythema.  Eyes:     Extraocular Movements: Extraocular movements intact.     Pupils: Pupils are equal, round, and reactive to light.  Neck:     Thyroid : No thyroid  mass or thyromegaly.  Cardiovascular:     Rate and Rhythm: Normal rate and regular rhythm.     Pulses: Normal pulses.     Heart sounds: Normal heart sounds. No murmur  heard. Pulmonary:     Effort: Pulmonary effort is normal. No respiratory distress.     Breath sounds: Normal breath sounds. No wheezing, rhonchi or rales.  Musculoskeletal:     Cervical back: Normal range of motion and neck supple.     Right lower leg: No edema.     Left lower leg: No edema.  Skin:    General: Skin is warm and dry.  Neurological:     Mental Status: He is alert.  Psychiatric:        Mood and Affect: Mood normal.        Behavior: Behavior normal.           04/12/2024    3:43 PM 07/08/2023    3:26 PM 09/11/2021    2:23 PM 03/31/2020    3:33 PM 06/25/2019    8:43 AM  Depression screen PHQ 2/9  Decreased Interest 0  0 0 0  Down, Depressed, Hopeless 0 0 0 0 0  PHQ - 2 Score 0 0 0 0 0  Altered sleeping 2 0  0 2  Tired, decreased energy 2 0  0 1  Change in appetite 0 0  0 0  Feeling bad or failure about yourself  0 0  0 0  Trouble concentrating 0 1  1 2   Moving slowly or fidgety/restless 0 0  0 1  Suicidal thoughts 0 0  0 0  PHQ-9 Score 4 1   1  6    Difficult doing work/chores Somewhat difficult Not difficult at all        Data saved with a previous flowsheet row definition       04/12/2024    3:44 PM 07/08/2023    3:26 PM 09/11/2021    2:24 PM 07/26/2020   12:11 PM  GAD 7 : Generalized Anxiety Score  Nervous, Anxious, on Edge 0 0 0 0  Control/stop worrying 0 0 0 0  Worry too much - different things 0 0 0 0  Trouble relaxing 0 1 0 1  Restless 0 0 0 0  Easily annoyed or irritable 0 0 0 0  Afraid - awful might happen 0 0 0 0  Total GAD 7 Score 0 1 0 1  Anxiety Difficulty Not difficult at all Not difficult at all  Not difficult at all   Assessment & Plan:   Problem List Items Addressed This Visit     Subclinical hypothyroidism  Chronic, update levothyroxine  levels with recent weight gain.       Relevant Orders   TSH   NASH (nonalcoholic steatohepatitis)   H/o chronic transaminitis due to fatty liver. 2023 RUQ US  reviewed.  Update labs today for  updated Fib4 score.       Relevant Orders   CBC with Differential/Platelet   Comprehensive metabolic panel with GFR   Severe obesity (BMI 35.0-39.9) with comorbidity (HCC) - Primary   Weight gain noted - without significant increased PO intake - he wonders if due to slowed metabolism.  Rpt TSH.  Patient is interested in GLP1RA vs GLP1/GIP. Reviewed mechanism of action of medication as well as side effects and adverse events to watch for including nausea, diarrhea, constipation, pancreatitis. No fmhx medullary thyroid  cancer or MEN2. Discussed titration schedule for medications.  He will check with insurance on coverage for these medications.       Anxiety   Continues doing well on low dose sertraline  25mg  daily.       OSA on CPAP   Continue nightly CPAP - last saw pulm 2022 - will call for f/u visit  to check settings.       Fatigue   Ongoing for almost the past year - update labs r/o reversible cause Recommend touch base with pulm on CPAP settings with weight gain noted.      Relevant Orders   Vitamin B12   Prediabetes   Update A1c .      Relevant Orders   Hemoglobin A1c     No orders of the defined types were placed in this encounter.   Orders Placed This Encounter  Procedures   CBC with Differential/Platelet   Comprehensive metabolic panel with GFR   TSH   Hemoglobin A1c   Vitamin B12    Patient Instructions  Contact [facility] in [place] to schedule an appointment: [phone] to recheck CPAP settings.  Labs today  Check with insurance on coverage for medications Wegovy or Zepbound.   Follow up plan: No follow-ups on file.  Anton Blas, MD

## 2024-04-12 NOTE — Patient Instructions (Addendum)
 Contact [facility] in [place] to schedule an appointment: [phone] to recheck CPAP settings.  Labs today  Check with insurance on coverage for medications Wegovy or Zepbound.

## 2024-04-12 NOTE — Assessment & Plan Note (Signed)
 Weight gain noted - without significant increased PO intake - he wonders if due to slowed metabolism.  Rpt TSH.  Patient is interested in GLP1RA vs GLP1/GIP. Reviewed mechanism of action of medication as well as side effects and adverse events to watch for including nausea, diarrhea, constipation, pancreatitis. No fmhx medullary thyroid  cancer or MEN2. Discussed titration schedule for medications.  He will check with insurance on coverage for these medications.

## 2024-04-12 NOTE — Assessment & Plan Note (Signed)
 Update A1c ?

## 2024-04-12 NOTE — Assessment & Plan Note (Signed)
 Ongoing for almost the past year - update labs r/o reversible cause Recommend touch base with pulm on CPAP settings with weight gain noted.

## 2024-04-13 LAB — VITAMIN B12: Vitamin B-12: 304 pg/mL (ref 211–911)

## 2024-04-13 LAB — TSH: TSH: 1.26 u[IU]/mL (ref 0.35–5.50)

## 2024-04-13 LAB — HEMOGLOBIN A1C: Hgb A1c MFr Bld: 5.7 % (ref 4.6–6.5)

## 2024-04-13 LAB — COMPREHENSIVE METABOLIC PANEL WITH GFR
ALT: 137 U/L — ABNORMAL HIGH (ref 0–53)
AST: 78 U/L — ABNORMAL HIGH (ref 0–37)
Albumin: 4.7 g/dL (ref 3.5–5.2)
Alkaline Phosphatase: 115 U/L (ref 39–117)
BUN: 14 mg/dL (ref 6–23)
CO2: 30 meq/L (ref 19–32)
Calcium: 9.7 mg/dL (ref 8.4–10.5)
Chloride: 102 meq/L (ref 96–112)
Creatinine, Ser: 0.96 mg/dL (ref 0.40–1.50)
GFR: 95.07 mL/min (ref >60.00)
Glucose, Bld: 105 mg/dL — ABNORMAL HIGH (ref 70–99)
Potassium: 4 meq/L (ref 3.5–5.1)
Sodium: 138 meq/L (ref 135–145)
Total Bilirubin: 0.9 mg/dL (ref 0.2–1.2)
Total Protein: 6.9 g/dL (ref 6.0–8.3)

## 2024-04-13 LAB — CBC WITH DIFFERENTIAL/PLATELET
Basophils Absolute: 0.1 K/uL (ref 0.0–0.1)
Basophils Relative: 1 % (ref 0.0–3.0)
Eosinophils Absolute: 0.1 K/uL (ref 0.0–0.7)
Eosinophils Relative: 2.1 % (ref 0.0–5.0)
HCT: 43.4 % (ref 39.0–52.0)
Hemoglobin: 15.3 g/dL (ref 13.0–17.0)
Lymphocytes Relative: 31.5 % (ref 12.0–46.0)
Lymphs Abs: 1.9 K/uL (ref 0.7–4.0)
MCHC: 35.3 g/dL (ref 30.0–36.0)
MCV: 86.3 fl (ref 78.0–100.0)
Monocytes Absolute: 0.6 K/uL (ref 0.1–1.0)
Monocytes Relative: 9.3 % (ref 3.0–12.0)
Neutro Abs: 3.4 K/uL (ref 1.4–7.7)
Neutrophils Relative %: 56.1 % (ref 43.0–77.0)
Platelets: 221 K/uL (ref 150.0–400.0)
RBC: 5.03 Mil/uL (ref 4.22–5.81)
RDW: 13 % (ref 11.5–15.5)
WBC: 6.1 K/uL (ref 4.0–10.5)

## 2024-04-15 ENCOUNTER — Ambulatory Visit: Payer: Self-pay | Admitting: Family Medicine

## 2024-04-15 DIAGNOSIS — E538 Deficiency of other specified B group vitamins: Secondary | ICD-10-CM | POA: Insufficient documentation

## 2024-04-15 DIAGNOSIS — K760 Fatty (change of) liver, not elsewhere classified: Secondary | ICD-10-CM

## 2024-04-15 MED ORDER — VITAMIN B-12 1000 MCG PO TABS: 1000.0000 ug | ORAL_TABLET | Freq: Every day | ORAL | Status: AC

## 2024-05-27 ENCOUNTER — Ambulatory Visit
Admission: RE | Admit: 2024-05-27 | Discharge: 2024-05-27 | Disposition: A | Source: Ambulatory Visit | Attending: Family Medicine | Admitting: Family Medicine

## 2024-05-27 DIAGNOSIS — K760 Fatty (change of) liver, not elsewhere classified: Secondary | ICD-10-CM

## 2024-05-29 ENCOUNTER — Ambulatory Visit: Payer: Self-pay | Admitting: Family Medicine

## 2024-05-29 DIAGNOSIS — K760 Fatty (change of) liver, not elsewhere classified: Secondary | ICD-10-CM

## 2024-05-29 DIAGNOSIS — K7581 Nonalcoholic steatohepatitis (NASH): Secondary | ICD-10-CM

## 2024-05-29 DIAGNOSIS — R932 Abnormal findings on diagnostic imaging of liver and biliary tract: Secondary | ICD-10-CM

## 2024-05-29 MED ORDER — WEGOVY 0.5 MG/0.5ML ~~LOC~~ SOAJ: 0.5000 mg | SUBCUTANEOUS | 0 refills | Status: AC

## 2024-05-29 MED ORDER — WEGOVY 0.25 MG/0.5ML ~~LOC~~ SOAJ: 0.2500 mg | SUBCUTANEOUS | 0 refills | Status: AC

## 2024-05-31 ENCOUNTER — Telehealth: Payer: Self-pay | Admitting: Family Medicine

## 2024-05-31 NOTE — Telephone Encounter (Signed)
 Called and spoke with pt.  Referral specialist sent letter with information to schedule appt with GI specialist.  Pt verbalized understanding the wrote down phone number to schedule appt.  Asked him to call with any concerns.

## 2024-05-31 NOTE — Telephone Encounter (Signed)
 Copied from CRM [id number]. Topic: Referral - Question >> May 31, 2024  8:46 AM Emylou G wrote: Reason for CRM: Spouse called - anxious to get referral scheduled.. I did adv of turn time.. She called the liver clinic they are still waiting on us 

## 2024-06-02 NOTE — Telephone Encounter (Signed)
 Can we request PA for wegovy  for new MASH diagnosis indication?   If denied, will try to get zepbound approved for OSA indication.

## 2024-06-04 ENCOUNTER — Encounter: Payer: Self-pay | Admitting: Family Medicine

## 2024-06-04 DIAGNOSIS — K7581 Nonalcoholic steatohepatitis (NASH): Secondary | ICD-10-CM
